# Patient Record
Sex: Female | Born: 2000 | Race: Black or African American | Hispanic: No | Marital: Single | Smoking: Never smoker
Health system: Southern US, Community
[De-identification: ages and names within clinical notes are randomized; demographics above are authoritative.]

## PROBLEM LIST (undated history)

## (undated) HISTORY — PX: NO PAST SURGERIES: SHX2092

---

## 2020-03-29 ENCOUNTER — Encounter (HOSPITAL_COMMUNITY): Payer: Self-pay

## 2020-03-29 ENCOUNTER — Other Ambulatory Visit: Payer: Self-pay

## 2020-03-29 ENCOUNTER — Ambulatory Visit (HOSPITAL_COMMUNITY)
Admission: EM | Admit: 2020-03-29 | Discharge: 2020-03-29 | Disposition: A | Discharge status: Home / Self Care | Payer: 59 | Attending: Family Medicine | Admitting: Family Medicine

## 2020-03-29 DIAGNOSIS — R112 Nausea with vomiting, unspecified: Secondary | ICD-10-CM | POA: Diagnosis not present

## 2020-03-29 DIAGNOSIS — K219 Gastro-esophageal reflux disease without esophagitis: Secondary | ICD-10-CM | POA: Diagnosis not present

## 2020-03-29 DIAGNOSIS — Z3202 Encounter for pregnancy test, result negative: Secondary | ICD-10-CM

## 2020-03-29 LAB — POCT URINALYSIS DIP (DEVICE)
Bilirubin Urine: NEGATIVE
Glucose, UA: NEGATIVE mg/dL
Hgb urine dipstick: NEGATIVE
Ketones, ur: NEGATIVE mg/dL
Leukocytes,Ua: NEGATIVE
Nitrite: NEGATIVE
Protein, ur: NEGATIVE mg/dL
Specific Gravity, Urine: 1.02 (ref 1.005–1.030)
Urobilinogen, UA: 0.2 mg/dL (ref 0.0–1.0)
pH: 8.5 — ABNORMAL HIGH (ref 5.0–8.0)

## 2020-03-29 LAB — POC URINE PREG, ED: Preg Test, Ur: NEGATIVE

## 2020-03-29 MED ORDER — ONDANSETRON 4 MG PO TBDP
4.0000 mg | ORAL_TABLET | Freq: Three times a day (TID) | ORAL | 0 refills | Status: DC | PRN
Start: 2020-03-29 — End: 2020-11-02

## 2020-03-29 MED ORDER — OMEPRAZOLE 20 MG PO CPDR
20.0000 mg | DELAYED_RELEASE_CAPSULE | Freq: Every day | ORAL | 1 refills | Status: DC
Start: 2020-03-29 — End: 2020-11-02

## 2020-03-29 NOTE — ED Provider Notes (Signed)
[facility]    CSN: [id number] Arrival date & time: 03/29/20  5701      History   Chief Complaint Chief Complaint  Patient presents with  . Nausea  . Emesis    1 episode    HPI Kaitlyn York is a 19 y.o. female.   Pt is a 19 year old female that presents with intermittent nausea, vomiting.  This is been ongoing off and on since February.  Worse over the last few days.  Reporting ate breakfast  yesterday and then a few hours later vomited.  She has had persistent nausea since.  Currently nauseous.  Denies any abdominal pain associated.  Symptoms are worse after eating symptoms and laying down.  Denies any urinary symptoms, constipation or diarrhea.  Denies any fever.  Currently sexually active.  Uses Nexplanon for birth control. Patient's last menstrual period was 03/24/2020 (exact date).   ROS per HPI      History reviewed. No pertinent past medical history.  There are no problems to display for this patient.   History reviewed. No pertinent surgical history.  OB History   No obstetric history on file.      Home Medications    Prior to Admission medications   Medication Sig Start Date End Date Taking? Authorizing Provider  etonogestrel (NEXPLANON) 68 MG IMPL implant 1 each by Subdermal route once.   Yes [provider]  omeprazole (PRILOSEC) 20 MG capsule Take 1 capsule (20 mg total) by mouth daily. 03/29/20   Dahlia Byes A, NP  ondansetron (ZOFRAN ODT) 4 MG disintegrating tablet Take 1 tablet (4 mg total) by mouth every 8 (eight) hours as needed for nausea or vomiting. 03/29/20   Janace Aris, NP    Family History History reviewed. No pertinent family history.  Social History Social History   Tobacco Use  . Smoking status: Never Smoker  . Smokeless tobacco: Never Used  Substance Use Topics  . Alcohol use: Yes    Comment: occasionally  . Drug use: Yes    Types: Marijuana     Allergies   Patient has no known  allergies.   Review of Systems Review of Systems   Physical Exam Triage Vital Signs ED Triage Vitals  Enc Vitals Group     BP 03/29/20 0940 (!) 157/82     Pulse Rate 03/29/20 0940 (!) 111     Resp 03/29/20 0940 16     Temp 03/29/20 0940 98.7 F (37.1 C)     Temp Source 03/29/20 0940 Oral     SpO2 03/29/20 0940 100 %     Weight --      Height --      Head Circumference --      Peak Flow --      Pain Score 03/29/20 0937 2     Pain Loc --      Pain Edu? --      Excl. in GC? --    No data found.  Updated Vital Signs BP (!) 157/82 (BP Location: Left Arm)   Pulse (!) 111   Temp 98.7 F (37.1 C) (Oral)   Resp 16   LMP 03/24/2020 (Exact Date)   SpO2 100%   Visual Acuity Right Eye Distance:   Left Eye Distance:   Bilateral Distance:    Right Eye Near:   Left Eye Near:    Bilateral Near:     Physical Exam Vitals and nursing note reviewed.  Constitutional:  General: She is not in acute distress.    Appearance: Normal appearance. She is not ill-appearing, toxic-appearing or diaphoretic.  HENT:     Head: Normocephalic.     Nose: Nose normal.     Mouth/Throat:     Pharynx: Oropharynx is clear.  Eyes:     Conjunctiva/sclera: Conjunctivae normal.  Pulmonary:     Effort: Pulmonary effort is normal.  Abdominal:     Palpations: Abdomen is soft.     Tenderness: There is no abdominal tenderness.     Comments: Abdomen soft, nontender  Musculoskeletal:        General: Normal range of motion.     Cervical back: Normal range of motion.  Skin:    General: Skin is warm and dry.     Findings: No rash.  Neurological:     Mental Status: She is alert.  Psychiatric:        Mood and Affect: Mood normal.      UC Treatments / Results  Labs (all labs ordered are listed, but only abnormal results are displayed) Labs Reviewed  POCT URINALYSIS DIP (DEVICE) - Abnormal; Notable for the following components:      Result Value   pH 8.5 (*)    All other components within  normal limits  POC URINE PREG, ED  POC URINE PREG, ED    EKG   Radiology No results found.  Procedures Procedures (including critical care time)  Medications Ordered in UC Medications - No data to display  Initial Impression / Assessment and Plan / UC Course  I have reviewed the triage vital signs and the nursing notes.  Pertinent labs & imaging results that were available during my care of the patient were reviewed by me and considered in my medical decision making (see chart for details).     GERD most likely cause of her symptoms.  No acute abdomen on exam Could be diet or stress related. We will have her start omeprazole daily with diet precautions and instructions. Zofran as needed for nausea, vomiting  recommended if symptoms continue or worsen she will need to see a GI specialist. Final Clinical Impressions(s) / UC Diagnoses   Final diagnoses:  Gastroesophageal reflux disease without esophagitis     Discharge Instructions     I believe your symptoms are associated with acid reflux.  We are going to try omeprazole 20 mg once daily  Make sure that you take the omeprazole 30- 60 minutes prior to a meal with a glass of water.  Avoid spicy, greasy foods, caffeine, chocolate and milk products.  No eating 2-3 hours before bedtime. Elevate the head of the bed 30 degrees.  Try this for a few weeks to see if this improves your symptoms.  If you don't see any improvement or your symptoms worsen please follow up with a GI  Zofran as needed for nausea and vomiting Try to reduce stress.       ED Prescriptions    Medication Sig Dispense Auth. Provider   omeprazole (PRILOSEC) 20 MG capsule Take 1 capsule (20 mg total) by mouth daily. 30 capsule Annamary Buschman A, NP   ondansetron (ZOFRAN ODT) 4 MG disintegrating tablet Take 1 tablet (4 mg total) by mouth every 8 (eight) hours as needed for nausea or vomiting. 20 tablet Loura Halt A, NP     PDMP not reviewed this  encounter.   Orvan July, NP 03/29/20 1014

## 2020-03-29 NOTE — Discharge Instructions (Addendum)
I believe your symptoms are associated with acid reflux.  We are going to try omeprazole 20 mg once daily  Make sure that you take the omeprazole 30- 60 minutes prior to a meal with a glass of water.  Avoid spicy, greasy foods, caffeine, chocolate and milk products.  No eating 2-3 hours before bedtime. Elevate the head of the bed 30 degrees.  Try this for a few weeks to see if this improves your symptoms.  If you don't see any improvement or your symptoms worsen please follow up with a GI  Zofran as needed for nausea and vomiting Try to reduce stress.

## 2020-03-29 NOTE — ED Triage Notes (Signed)
Patient reports nausea that is worse when lying down. Yesterday she had one episode of emesis.

## 2020-08-09 ENCOUNTER — Encounter (HOSPITAL_COMMUNITY): Payer: Self-pay

## 2020-08-09 ENCOUNTER — Emergency Department (HOSPITAL_COMMUNITY)
Admission: EM | Admit: 2020-08-09 | Discharge: 2020-08-09 | Disposition: A | Discharge status: Home / Self Care | Payer: 59 | Attending: Emergency Medicine | Admitting: Emergency Medicine

## 2020-08-09 ENCOUNTER — Emergency Department (HOSPITAL_COMMUNITY)
Admission: EM | Admit: 2020-08-09 | Discharge: 2020-08-10 | Disposition: A | Discharge status: Home / Self Care | Payer: 59 | Source: Home / Self Care | Attending: Emergency Medicine | Admitting: Emergency Medicine

## 2020-08-09 ENCOUNTER — Other Ambulatory Visit: Payer: Self-pay

## 2020-08-09 DIAGNOSIS — F1210 Cannabis abuse, uncomplicated: Secondary | ICD-10-CM | POA: Diagnosis not present

## 2020-08-09 DIAGNOSIS — F12188 Cannabis abuse with other cannabis-induced disorder: Secondary | ICD-10-CM

## 2020-08-09 DIAGNOSIS — R112 Nausea with vomiting, unspecified: Secondary | ICD-10-CM | POA: Diagnosis not present

## 2020-08-09 DIAGNOSIS — R1110 Vomiting, unspecified: Secondary | ICD-10-CM | POA: Diagnosis present

## 2020-08-09 DIAGNOSIS — R197 Diarrhea, unspecified: Secondary | ICD-10-CM

## 2020-08-09 DIAGNOSIS — R9431 Abnormal electrocardiogram [ECG] [EKG]: Secondary | ICD-10-CM

## 2020-08-09 DIAGNOSIS — Z79899 Other long term (current) drug therapy: Secondary | ICD-10-CM | POA: Insufficient documentation

## 2020-08-09 LAB — COMPREHENSIVE METABOLIC PANEL
ALT: 19 U/L (ref 0–44)
AST: 27 U/L (ref 15–41)
Albumin: 4.4 g/dL (ref 3.5–5.0)
Alkaline Phosphatase: 76 U/L (ref 38–126)
Anion gap: 17 — ABNORMAL HIGH (ref 5–15)
BUN: 8 mg/dL (ref 6–20)
CO2: 20 mmol/L — ABNORMAL LOW (ref 22–32)
Calcium: 9.4 mg/dL (ref 8.9–10.3)
Chloride: 101 mmol/L (ref 98–111)
Creatinine, Ser: 0.73 mg/dL (ref 0.44–1.00)
GFR calc Af Amer: >60 mL/min (ref >60)
GFR calc non Af Amer: >60 mL/min (ref >60)
Glucose, Bld: 124 mg/dL — ABNORMAL HIGH (ref 70–99)
Potassium: 3.3 mmol/L — ABNORMAL LOW (ref 3.5–5.1)
Sodium: 138 mmol/L (ref 135–145)
Total Bilirubin: 1.3 mg/dL — ABNORMAL HIGH (ref 0.3–1.2)
Total Protein: 8.2 g/dL — ABNORMAL HIGH (ref 6.5–8.1)

## 2020-08-09 LAB — URINALYSIS, ROUTINE W REFLEX MICROSCOPIC
Bilirubin Urine: NEGATIVE
Glucose, UA: 50 mg/dL — AB
Hgb urine dipstick: NEGATIVE
Ketones, ur: 80 mg/dL — AB
Leukocytes,Ua: NEGATIVE
Nitrite: NEGATIVE
Protein, ur: 100 mg/dL — AB
Specific Gravity, Urine: 1.036 — ABNORMAL HIGH (ref 1.005–1.030)
pH: 6 (ref 5.0–8.0)

## 2020-08-09 LAB — CBC
HCT: 49.8 % — ABNORMAL HIGH (ref 36.0–46.0)
Hemoglobin: 16.6 g/dL — ABNORMAL HIGH (ref 12.0–15.0)
MCH: 29.4 pg (ref 26.0–34.0)
MCHC: 33.3 g/dL (ref 30.0–36.0)
MCV: 88.3 fL (ref 80.0–100.0)
Platelets: 377 10*3/uL (ref 150–400)
RBC: 5.64 MIL/uL — ABNORMAL HIGH (ref 3.87–5.11)
RDW: 13.2 % (ref 11.5–15.5)
WBC: 9.9 10*3/uL (ref 4.0–10.5)
nRBC: 0 % (ref 0.0–0.2)

## 2020-08-09 LAB — LIPASE, BLOOD: Lipase: 20 U/L (ref 11–51)

## 2020-08-09 LAB — I-STAT BETA HCG BLOOD, ED (MC, WL, AP ONLY): I-stat hCG, quantitative: <5 m[IU]/mL (ref <5)

## 2020-08-09 MED ORDER — SODIUM CHLORIDE 0.9 % IV BOLUS
1000.0000 mL | Freq: Once | INTRAVENOUS | Status: AC
  Administered 2020-08-09: 1000 mL via INTRAVENOUS

## 2020-08-09 MED ORDER — POTASSIUM CHLORIDE CRYS ER 20 MEQ PO TBCR
40.0000 meq | EXTENDED_RELEASE_TABLET | Freq: Once | ORAL | Status: AC
  Administered 2020-08-09: 40 meq via ORAL
  Filled 2020-08-09: qty 2

## 2020-08-09 MED ORDER — ONDANSETRON 4 MG PO TBDP
4.0000 mg | ORAL_TABLET | Freq: Once | ORAL | Status: AC | PRN
  Administered 2020-08-09: 4 mg via ORAL
  Filled 2020-08-09: qty 1

## 2020-08-09 MED ORDER — HALOPERIDOL LACTATE 5 MG/ML IJ SOLN
2.0000 mg | Freq: Once | INTRAMUSCULAR | Status: AC
  Administered 2020-08-10: 2 mg via INTRAMUSCULAR
  Filled 2020-08-09: qty 1

## 2020-08-09 MED ORDER — PROMETHAZINE HCL 25 MG PO TABS
25.0000 mg | ORAL_TABLET | Freq: Four times a day (QID) | ORAL | 0 refills | Status: DC | PRN
Start: 2020-08-09 — End: 2020-11-02

## 2020-08-09 MED ORDER — METOCLOPRAMIDE HCL 5 MG/ML IJ SOLN
10.0000 mg | Freq: Once | INTRAMUSCULAR | Status: AC
  Administered 2020-08-09: 10 mg via INTRAVENOUS
  Filled 2020-08-09: qty 2

## 2020-08-09 NOTE — ED Triage Notes (Signed)
Pt reports she was here this morning for N/V and is now back for the same. Pt reports taking promethazine around 530p today and then vomiting about an hour later.

## 2020-08-09 NOTE — ED Notes (Signed)
Pt eating ice and drinking water at this time; no complaints of N/V

## 2020-08-09 NOTE — ED Notes (Signed)
Pt vomited 2x in triage.

## 2020-08-09 NOTE — Discharge Instructions (Signed)
Take the medications as needed instead of the Zofran. Make sure you are drinking plenty of fluids and avoid sugary drinks. Follow-up with your primary care provider or the 1 listed below. Return to the ER if you start to experience worsening diarrhea or bloody stools, vomiting, severe abdominal pain or fever.

## 2020-08-09 NOTE — ED Triage Notes (Signed)
Pt reports vomiting, nausea, and diarrhea since eating [employer] a few days ago. Reports that she took some Zofran PTA. States that she hasnt been able to keep anything down.

## 2020-08-09 NOTE — ED Provider Notes (Signed)
[facility]-EMERGENCY DEPT Provider Note   CSN: [id number] Arrival date & time: 08/09/20  0404     History Chief Complaint  Patient presents with  . Emesis    Kaitlyn York is a 19 y.o. female who presents to ED with a chief complaint of diarrhea and emesis.  States that she ate a meal at [employer] yesterday with some friends who are experiencing similar symptoms.  She had 2 episodes of nonbloody diarrhea and several episodes of nonbloody, nonbilious emesis since eating there.  She has Zofran at home which she took without much improvement in her symptoms.  She also tried drinking ginger ale and taking Pepto-Bismol with subsequent vomiting.  Denies abdominal pain, just reports feeling hungry.  Denies any urinary symptoms or possibility of pregnancy.  Denies any fevers, prior abdominal surgeries, chest pain, cough, pelvic pain.  HPI     History reviewed. No pertinent past medical history.  There are no problems to display for this patient.   History reviewed. No pertinent surgical history.   OB History   No obstetric history on file.     History reviewed. No pertinent family history.  Social History   Tobacco Use  . Smoking status: Never Smoker  . Smokeless tobacco: Never Used  Vaping Use  . Vaping Use: Never used  Substance Use Topics  . Alcohol use: Yes    Comment: occasionally  . Drug use: Yes    Types: Marijuana    Home Medications Prior to Admission medications   Medication Sig Start Date End Date Taking? Authorizing Provider  bismuth subsalicylate (PEPTO BISMOL) 262 MG/15ML suspension Take 30 mLs by mouth every 6 (six) hours as needed for indigestion.   Yes [provider]  etonogestrel (NEXPLANON) 68 MG IMPL implant 1 each by Subdermal route once.   Yes [provider]  ibuprofen (ADVIL) 200 MG tablet Take 600 mg by mouth every 6 (six) hours as needed for headache.   Yes [provider]  omeprazole (PRILOSEC)  20 MG capsule Take 1 capsule (20 mg total) by mouth daily. Patient not taking: Reported on 08/09/2020 03/29/20   Dahlia Byes A, NP  ondansetron (ZOFRAN ODT) 4 MG disintegrating tablet Take 1 tablet (4 mg total) by mouth every 8 (eight) hours as needed for nausea or vomiting. Patient not taking: Reported on 08/09/2020 03/29/20   Dahlia Byes A, NP  promethazine (PHENERGAN) 25 MG tablet Take 1 tablet (25 mg total) by mouth every 6 (six) hours as needed for nausea or vomiting. 08/09/20   Dietrich Pates, PA-C    Allergies    Patient has no known allergies.  Review of Systems   Review of Systems  Constitutional: Negative for appetite change, chills and fever.  HENT: Negative for ear pain, rhinorrhea, sneezing and sore throat.   Eyes: Negative for photophobia and visual disturbance.  Respiratory: Negative for cough, chest tightness, shortness of breath and wheezing.   Cardiovascular: Negative for chest pain and palpitations.  Gastrointestinal: Positive for diarrhea and vomiting. Negative for abdominal pain, blood in stool, constipation and nausea.  Genitourinary: Negative for dysuria, hematuria and urgency.  Musculoskeletal: Negative for myalgias.  Skin: Negative for rash.  Neurological: Negative for dizziness, weakness and light-headedness.    Physical Exam Updated Vital Signs BP (!) 145/89   Pulse 96   Temp 98.1 F (36.7 C) (Oral)   Resp 18   Ht 5\' 4"  (1.626 m)   Wt 73 kg   SpO2 100%   BMI  27.64 kg/m   Physical Exam Vitals and nursing note reviewed.  Constitutional:      General: She is not in acute distress.    Appearance: She is well-developed.  HENT:     Head: Normocephalic and atraumatic.     Nose: Nose normal.  Eyes:     General: No scleral icterus.       Right eye: No discharge.        Left eye: No discharge.     Conjunctiva/sclera: Conjunctivae normal.  Cardiovascular:     Rate and Rhythm: Normal rate and regular rhythm.     Heart sounds: Normal heart sounds. No murmur  heard.  No friction rub. No gallop.   Pulmonary:     Effort: Pulmonary effort is normal. No respiratory distress.     Breath sounds: Normal breath sounds.  Abdominal:     General: Bowel sounds are normal. There is no distension.     Palpations: Abdomen is soft.     Tenderness: There is no abdominal tenderness. There is no guarding.     Comments: Soft, nontender nondistended.  Musculoskeletal:        General: Normal range of motion.     Cervical back: Normal range of motion and neck supple.  Skin:    General: Skin is warm and dry.     Findings: No rash.  Neurological:     Mental Status: She is alert.     Motor: No abnormal muscle tone.     Coordination: Coordination normal.     ED Results / Procedures / Treatments   Labs (all labs ordered are listed, but only abnormal results are displayed) Labs Reviewed  COMPREHENSIVE METABOLIC PANEL - Abnormal; Notable for the following components:      Result Value   Potassium 3.3 (*)    CO2 20 (*)    Glucose, Bld 124 (*)    Total Protein 8.2 (*)    Total Bilirubin 1.3 (*)    Anion gap 17 (*)    All other components within normal limits  CBC - Abnormal; Notable for the following components:   RBC 5.64 (*)    Hemoglobin 16.6 (*)    HCT 49.8 (*)    All other components within normal limits  URINALYSIS, ROUTINE W REFLEX MICROSCOPIC - Abnormal; Notable for the following components:   Color, Urine AMBER (*)    Specific Gravity, Urine 1.036 (*)    Glucose, UA 50 (*)    Ketones, ur 80 (*)    Protein, ur 100 (*)    Bacteria, UA RARE (*)    All other components within normal limits  LIPASE, BLOOD  I-STAT BETA HCG BLOOD, ED ([facility], [facility], [facility] ONLY)    EKG None  Radiology No results found.  Procedures Procedures (including critical care time)  Medications Ordered in ED Medications  potassium chloride SA (KLOR-CON) CR tablet 40 mEq (has no administration in time range)  ondansetron (ZOFRAN-ODT) disintegrating tablet 4 mg (4 mg Oral  Given 08/09/20 0536)  sodium chloride 0.9 % bolus 1,000 mL (0 mLs Intravenous Stopped 08/09/20 0940)  metoCLOPramide (REGLAN) injection 10 mg (10 mg Intravenous Given 08/09/20 0746)    ED Course  I have reviewed the triage vital signs and the nursing notes.  Pertinent labs & imaging results that were available during my care of the patient were reviewed by me and considered in my medical decision making (see chart for details).  Clinical Course as of Aug 10 943  [facility]  Aug 09, 2020  0711 Called lab regarding patient's lab work which is not yet in process.  They will begin running it now despite it being collected 2hrs ago.   [HK]    Clinical Course User Index [HK] Dietrich Pates, PA-C   MDM Rules/Calculators/A&P                          19 year old female presenting to the ED with a chief complaint of diarrhea and emesis since eating a meal at [employer] yesterday.  Sick contacts with similar symptoms.  2 episodes of nonbloody diarrhea and several episodes of nonbloody, nonbilious emesis.  Minimal improvement noted with her home Zofran as well as taking Pepto-Bismol.  Denies abdominal pain.  No urinary symptoms, pelvic complaints or possibility of pregnancy.  On exam abdomen is soft, nontender nondistended.  She was given ODT Zofran in triage but had subsequent vomiting.  She is afebrile here without recent use of antipyretics.  Urinalysis with some ketones, rare bacteria.  hCG is negative.  Will give IV fluids, antiemetics and reassess as we await lab work.  8:52 AM Lab work showing some hemoconcentration of CBC.  CMP significant for anion gap of 17, potassium of 3.3 which is repleted orally.  Lipase is unremarkable.  Patient with significant improvement in her symptoms with IV fluids and antiemetics.  She is able to tolerate p.o. intake.  Suspect that her symptoms are viral in nature.  Abdominal exams remain benign so I doubt acute surgical cause of her symptoms such as cholecystitis, appendicitis or  perforation.  She is comfortable with discharge home with instructions to slowly advance her diet and take antiemetics as needed.  Strict return precautions given.   Patient is hemodynamically stable, in NAD, and able to ambulate in the ED. Evaluation does not show pathology that would require ongoing emergent intervention or inpatient treatment. I explained the diagnosis to the patient. Pain has been managed and has no complaints prior to discharge. Patient is comfortable with above plan and is stable for discharge at this time. All questions were answered prior to disposition. Strict return precautions for returning to the ED were discussed. Encouraged follow up with PCP.   An After Visit Summary was printed and given to the patient.   Portions of this note were generated with Scientist, clinical (histocompatibility and immunogenetics). Dictation errors may occur despite best attempts at proofreading.  Final Clinical Impression(s) / ED Diagnoses Final diagnoses:  Nausea vomiting and diarrhea    Rx / DC Orders ED Discharge Orders         Ordered    promethazine (PHENERGAN) 25 MG tablet  Every 6 hours PRN        08/09/20 0854           Dietrich Pates, PA-C 08/09/20 0944    Mancel Bale, MD 08/09/20 [phone]

## 2020-08-10 NOTE — ED Notes (Signed)
Per Dr. Nicanor Alcon hold labs at this time will reassess need due to labs previously drawn advised to continue with IM haldol.

## 2020-08-10 NOTE — ED Notes (Signed)
PT tolerated ginger-ale at this time

## 2020-08-10 NOTE — ED Provider Notes (Signed)
[facility]-EMERGENCY DEPT Provider Note   CSN: [id number] Arrival date & time: 08/09/20  2116     History Chief Complaint  Patient presents with  . Emesis    Kaitlyn York is a 19 y.o. female.  The history is provided by the patient.  Emesis Severity:  Moderate Duration:  1 day Timing:  Intermittent Quality:  Stomach contents Progression:  Unchanged Chronicity:  Recurrent Recent urination:  Normal Context: not post-tussive   Relieved by:  Nothing Worsened by:  Nothing Ineffective treatments:  None tried Associated symptoms: no abdominal pain, no fever, no headaches, no myalgias, no sore throat and no URI   Risk factors: no alcohol use   Risk factors comment:  Marijuana use       History reviewed. No pertinent past medical history.  There are no problems to display for this patient.   History reviewed. No pertinent surgical history.   OB History   No obstetric history on file.     History reviewed. No pertinent family history.  Social History   Tobacco Use  . Smoking status: Never Smoker  . Smokeless tobacco: Never Used  Vaping Use  . Vaping Use: Never used  Substance Use Topics  . Alcohol use: Yes    Comment: occasionally  . Drug use: Yes    Types: Marijuana    Home Medications Prior to Admission medications   Medication Sig Start Date End Date Taking? Authorizing Provider  bismuth subsalicylate (PEPTO BISMOL) 262 MG/15ML suspension Take 30 mLs by mouth every 6 (six) hours as needed for indigestion.   Yes [provider]  etonogestrel (NEXPLANON) 68 MG IMPL implant 1 each by Subdermal route once.   Yes [provider]  ibuprofen (ADVIL) 200 MG tablet Take 600 mg by mouth every 6 (six) hours as needed for headache.   Yes [provider]  Multiple Vitamin (MULTIVITAMIN) capsule Take 1 capsule by mouth daily.   Yes [provider]  omega-3 acid ethyl esters (LOVAZA) 1 g capsule Take 1 g by  mouth 2 (two) times daily.   Yes [provider]  omeprazole (PRILOSEC) 20 MG capsule Take 1 capsule (20 mg total) by mouth daily. 03/29/20  Yes Bast, Traci A, NP  promethazine (PHENERGAN) 25 MG tablet Take 1 tablet (25 mg total) by mouth every 6 (six) hours as needed for nausea or vomiting. 08/09/20  Yes Khatri, Hina, PA-C  ondansetron (ZOFRAN ODT) 4 MG disintegrating tablet Take 1 tablet (4 mg total) by mouth every 8 (eight) hours as needed for nausea or vomiting. Patient not taking: Reported on 08/09/2020 03/29/20   Janace Aris, NP    Allergies    Patient has no known allergies.  Review of Systems   Review of Systems  Constitutional: Negative for fever.  HENT: Negative for sore throat.   Eyes: Negative for visual disturbance.  Respiratory: Negative for shortness of breath.   Cardiovascular: Negative for chest pain.  Gastrointestinal: Positive for nausea and vomiting. Negative for abdominal pain.  Genitourinary: Negative for difficulty urinating.  Musculoskeletal: Negative for myalgias.  Skin: Negative for rash.  Neurological: Negative for headaches.  Psychiatric/Behavioral: Negative for agitation.  All other systems reviewed and are negative.   Physical Exam Updated Vital Signs BP 128/83   Pulse 74   Temp 99 F (37.2 C) (Oral)   Resp 16   SpO2 100%   Physical Exam Vitals and nursing note reviewed.  Constitutional:      General: She is  not in acute distress.    Appearance: Normal appearance.  HENT:     Head: Normocephalic and atraumatic.     Nose: Nose normal.  Eyes:     Conjunctiva/sclera: Conjunctivae normal.     Pupils: Pupils are equal, round, and reactive to light.  Cardiovascular:     Rate and Rhythm: Normal rate and regular rhythm.     Pulses: Normal pulses.     Heart sounds: Normal heart sounds.  Pulmonary:     Effort: Pulmonary effort is normal.     Breath sounds: Normal breath sounds.  Abdominal:     General: Abdomen is flat. Bowel sounds are  normal.     Palpations: Abdomen is soft.     Tenderness: There is no abdominal tenderness. There is no guarding.  Musculoskeletal:        General: Normal range of motion.     Cervical back: Normal range of motion and neck supple.  Skin:    General: Skin is warm and dry.     Capillary Refill: Capillary refill takes less than 2 seconds.  Neurological:     General: No focal deficit present.     Mental Status: She is alert and oriented to person, place, and time.  Psychiatric:        Mood and Affect: Mood normal.        Behavior: Behavior normal.     ED Results / Procedures / Treatments   Labs (all labs ordered are listed, but only abnormal results are displayed) Labs Reviewed  CBC WITH DIFFERENTIAL/PLATELET  I-STAT CHEM 8, ED  I-STAT BETA HCG BLOOD, ED ([facility], [facility], [facility] ONLY)  TROPONIN I (HIGH SENSITIVITY)    EKG None  Radiology No results found.  Procedures Procedures (including critical care time)  Medications Ordered in ED Medications  haloperidol lactate (HALDOL) injection 2 mg (2 mg Intramuscular Given 08/10/20 0032)    ED Course  I have reviewed the triage vital signs and the nursing notes.  Pertinent labs & imaging results that were available during my care of the patient were reviewed by me and considered in my medical decision making (see chart for details).    1239 case d/w Herbert Seta, fellow for cardiology who has reviewed EKG.  Can be discharged with close follow up if no chest pain or syncope.  Patient to call office for outpatient testing  1245 discussed findings of EKG with patient.  Patient denies any chest pain, SOB or syncope.  Informed patient of the abnormal finding on EKG and need to call cardiology in AM for close follow up.  Patient verbalizes understanding and agrees to follow up   Patient informed of need to stop marijuana use as this is almost certainly what has caused her repeat episodes of vomiting in the last month.  Patient is grateful for this  information and agrees to stop.   Given haldol, resting comfortably.  No further emesis.    PO challenged successfully in the department.   Kaitlyn York was evaluated in Emergency Department on 08/10/2020 for the symptoms described in the history of present illness. She was evaluated in the context of the global COVID-19 pandemic, which necessitated consideration that the patient might be at risk for infection with the SARS-CoV-2 virus that causes COVID-19. Institutional protocols and algorithms that pertain to the evaluation of patients at risk for COVID-19 are in a state of rapid change based on information released by regulatory bodies including the CDC and federal and state organizations. These  policies and algorithms were followed during the patient's care in the ED.  Final Clinical Impression(s) / ED Diagnoses  Return for intractable cough, coughing up blood,fevers >100.4 unrelieved by medication, shortness of breath, intractable vomiting, chest pain, shortness of breath, weakness,numbness, changes in speech, facial asymmetry,abdominal pain, passing out,Inability to tolerate liquids or food, cough, altered mental status or any concerns. No signs of systemic illness or infection. The patient is nontoxic-appearing on exam and vital signs are within normal limits.   I have reviewed the triage vital signs and the nursing notes. Pertinent labs &imaging results that were available during my care of the patient were reviewed by me and considered in my medical decision making (see chart for details).After history, exam, and medical workup I feel the patient has beenappropriately medically screened and is safe for discharge home. Pertinent diagnoses were discussed with the patient. Patient was given return precautions.      Vernell Back, MD 08/10/20 [phone]

## 2020-08-10 NOTE — ED Notes (Signed)
Patient denies pain and is resting comfortably.  

## 2020-11-02 ENCOUNTER — Other Ambulatory Visit: Payer: Self-pay

## 2020-11-02 ENCOUNTER — Ambulatory Visit: Payer: Self-pay

## 2020-11-02 ENCOUNTER — Ambulatory Visit: Payer: 59 | Attending: Internal Medicine | Admitting: Internal Medicine

## 2020-11-02 ENCOUNTER — Encounter: Payer: Self-pay | Admitting: Internal Medicine

## 2020-11-02 VITALS — BP 140/80 | HR 92 | Temp 96.6°F | Resp 16 | Ht 64.0 in | Wt 168.0 lb

## 2020-11-02 DIAGNOSIS — F321 Major depressive disorder, single episode, moderate: Secondary | ICD-10-CM | POA: Diagnosis not present

## 2020-11-02 DIAGNOSIS — Z7689 Persons encountering health services in other specified circumstances: Secondary | ICD-10-CM

## 2020-11-02 DIAGNOSIS — Z7289 Other problems related to lifestyle: Secondary | ICD-10-CM | POA: Insufficient documentation

## 2020-11-02 DIAGNOSIS — Z2821 Immunization not carried out because of patient refusal: Secondary | ICD-10-CM

## 2020-11-02 DIAGNOSIS — E663 Overweight: Secondary | ICD-10-CM

## 2020-11-02 DIAGNOSIS — F411 Generalized anxiety disorder: Secondary | ICD-10-CM | POA: Insufficient documentation

## 2020-11-02 DIAGNOSIS — Z113 Encounter for screening for infections with a predominantly sexual mode of transmission: Secondary | ICD-10-CM

## 2020-11-02 DIAGNOSIS — Z8619 Personal history of other infectious and parasitic diseases: Secondary | ICD-10-CM

## 2020-11-02 DIAGNOSIS — R030 Elevated blood-pressure reading, without diagnosis of hypertension: Secondary | ICD-10-CM | POA: Insufficient documentation

## 2020-11-02 DIAGNOSIS — Z114 Encounter for screening for human immunodeficiency virus [HIV]: Secondary | ICD-10-CM

## 2020-11-02 DIAGNOSIS — Z1159 Encounter for screening for other viral diseases: Secondary | ICD-10-CM

## 2020-11-02 MED ORDER — ACYCLOVIR 400 MG PO TABS: 400.0000 mg | ORAL_TABLET | Freq: Two times a day (BID) | ORAL | 2 refills | Status: DC

## 2020-11-02 MED ORDER — ACYCLOVIR 400 MG PO TABS: 400.0000 mg | ORAL_TABLET | Freq: Every day | ORAL | 2 refills | Status: DC

## 2020-11-02 MED ORDER — SERTRALINE HCL 50 MG PO TABS: ORAL_TABLET | ORAL | 1 refills | Status: DC

## 2020-11-02 NOTE — Patient Instructions (Signed)
Try to get in some form of moderate intensity exercise 3-4 days a week for 30-45 minutes.   DASH Eating Plan DASH stands for "Dietary Approaches to Stop Hypertension." The DASH eating plan is a healthy eating plan that has been shown to reduce high blood pressure (hypertension). It may also reduce your risk for type 2 diabetes, heart disease, and stroke. The DASH eating plan may also help with weight loss. What are tips for following this plan?  General guidelines  Avoid eating more than 2,300 mg (milligrams) of salt (sodium) a day. If you have hypertension, you may need to reduce your sodium intake to 1,500 mg a day.  Limit alcohol intake to no more than 1 drink a day for nonpregnant women and 2 drinks a day for men. One drink equals 12 oz of beer, 5 oz of wine, or 1 oz of hard liquor.  Work with your health care provider to maintain a healthy body weight or to lose weight. Ask what an ideal weight is for you.  Get at least 30 minutes of exercise that causes your heart to beat faster (aerobic exercise) most days of the week. Activities may include walking, swimming, or biking.  Work with your health care provider or diet and nutrition specialist (dietitian) to adjust your eating plan to your individual calorie needs. Reading food labels   Check food labels for the amount of sodium per serving. Choose foods with less than 5 percent of the Daily Value of sodium. Generally, foods with less than 300 mg of sodium per serving fit into this eating plan.  To find whole grains, look for the word "whole" as the first word in the ingredient list. Shopping  Buy products labeled as "low-sodium" or "no salt added."  Buy fresh foods. Avoid canned foods and premade or frozen meals. Cooking  Avoid adding salt when cooking. Use salt-free seasonings or herbs instead of table salt or sea salt. Check with your health care provider or pharmacist before using salt substitutes.  Do not fry foods. Cook  foods using healthy methods such as baking, boiling, grilling, and broiling instead.  Cook with heart-healthy oils, such as olive, canola, soybean, or sunflower oil. Meal planning  Eat a balanced diet that includes: ? 5 or more servings of fruits and vegetables each day. At each meal, try to fill half of your plate with fruits and vegetables. ? Up to 6-8 servings of whole grains each day. ? Less than 6 oz of lean meat, poultry, or fish each day. A 3-oz serving of meat is about the same size as a deck of cards. One egg equals 1 oz. ? 2 servings of low-fat dairy each day. ? A serving of nuts, seeds, or beans 5 times each week. ? Heart-healthy fats. Healthy fats called Omega-3 fatty acids are found in foods such as flaxseeds and coldwater fish, like sardines, salmon, and mackerel.  Limit how much you eat of the following: ? Canned or prepackaged foods. ? Food that is high in trans fat, such as fried foods. ? Food that is high in saturated fat, such as fatty meat. ? Sweets, desserts, sugary drinks, and other foods with added sugar. ? Full-fat dairy products.  Do not salt foods before eating.  Try to eat at least 2 vegetarian meals each week.  Eat more home-cooked food and less restaurant, buffet, and fast food.  When eating at a restaurant, ask that your food be prepared with less salt or no salt, if  possible. What foods are recommended? The items listed may not be a complete list. Talk with your dietitian about what dietary choices are best for you. Grains Whole-grain or whole-wheat bread. Whole-grain or whole-wheat pasta. Brown rice. [place] Morrow. Bulgur. Whole-grain and low-sodium cereals. Pita bread. Low-fat, low-sodium crackers. Whole-wheat flour tortillas. Vegetables Fresh or frozen vegetables (raw, steamed, roasted, or grilled). Low-sodium or reduced-sodium tomato and vegetable juice. Low-sodium or reduced-sodium tomato sauce and tomato paste. Low-sodium or reduced-sodium canned  vegetables. Fruits All fresh, dried, or frozen fruit. Canned fruit in natural juice (without added sugar). Meat and other protein foods Skinless chicken or [place]. Ground chicken or [place]. Pork with fat trimmed off. Fish and seafood. Egg whites. Dried beans, peas, or lentils. Unsalted nuts, nut butters, and seeds. Unsalted canned beans. Lean cuts of beef with fat trimmed off. Low-sodium, lean deli meat. Dairy Low-fat (1%) or fat-free (skim) milk. Fat-free, low-fat, or reduced-fat cheeses. Nonfat, low-sodium ricotta or cottage cheese. Low-fat or nonfat yogurt. Low-fat, low-sodium cheese. Fats and oils Soft margarine without trans fats. Vegetable oil. Low-fat, reduced-fat, or light mayonnaise and salad dressings (reduced-sodium). Canola, safflower, olive, soybean, and sunflower oils. Avocado. Seasoning and other foods Herbs. Spices. Seasoning mixes without salt. Unsalted popcorn and pretzels. Fat-free sweets. What foods are not recommended? The items listed may not be a complete list. Talk with your dietitian about what dietary choices are best for you. Grains Baked goods made with fat, such as croissants, muffins, or some breads. Dry pasta or rice meal packs. Vegetables Creamed or fried vegetables. Vegetables in a cheese sauce. Regular canned vegetables (not low-sodium or reduced-sodium). Regular canned tomato sauce and paste (not low-sodium or reduced-sodium). Regular tomato and vegetable juice (not low-sodium or reduced-sodium). Angie Fava. Olives. Fruits Canned fruit in a light or heavy syrup. Fried fruit. Fruit in cream or butter sauce. Meat and other protein foods Fatty cuts of meat. Ribs. Fried meat. Berniece Salines. Sausage. Bologna and other processed lunch meats. Salami. Fatback. Hotdogs. Bratwurst. Salted nuts and seeds. Canned beans with added salt. Canned or smoked fish. Whole eggs or egg yolks. Chicken or [place] with skin. Dairy Whole or 2% milk, cream, and half-and-half. Whole or full-fat  cream cheese. Whole-fat or sweetened yogurt. Full-fat cheese. Nondairy creamers. Whipped toppings. Processed cheese and cheese spreads. Fats and oils Butter. Stick margarine. Lard. Shortening. Ghee. Bacon fat. Tropical oils, such as coconut, palm kernel, or palm oil. Seasoning and other foods Salted popcorn and pretzels. Onion salt, garlic salt, seasoned salt, table salt, and sea salt. Worcestershire sauce. Tartar sauce. Barbecue sauce. Teriyaki sauce. Soy sauce, including reduced-sodium. Steak sauce. Canned and packaged gravies. Fish sauce. Oyster sauce. Cocktail sauce. Horseradish that you find on the shelf. Ketchup. Mustard. Meat flavorings and tenderizers. Bouillon cubes. Hot sauce and Tabasco sauce. Premade or packaged marinades. Premade or packaged taco seasonings. Relishes. Regular salad dressings. Where to find more information:  National Heart, Lung, and [place]: https://wilson-eaton.com/  American Heart Association: www.heart.org Summary  The DASH eating plan is a healthy eating plan that has been shown to reduce high blood pressure (hypertension). It may also reduce your risk for type 2 diabetes, heart disease, and stroke.  With the DASH eating plan, you should limit salt (sodium) intake to 2,300 mg a day. If you have hypertension, you may need to reduce your sodium intake to 1,500 mg a day.  When on the DASH eating plan, aim to eat more fresh fruits and vegetables, whole grains, lean proteins, low-fat dairy, and heart-healthy fats.  Work with your health care provider or diet and nutrition specialist (dietitian) to adjust your eating plan to your individual calorie needs. This information is not intended to replace advice given to you by your health care provider. Make sure you discuss any questions you have with your health care provider. Document Revised: 11/02/2017 Document Reviewed: 11/13/2016 Elsevier Patient Education  2020 Else[employer] Sertraline tablets What is this  medicine? SERTRALINE (SER tra leen) is used to treat depression. It may also be used to treat obsessive compulsive disorder, panic disorder, post-trauma stress, premenstrual dysphoric disorder (PMDD) or social anxiety. This medicine may be used for other purposes; ask your health care provider or pharmacist if you have questions. COMMON BRAND NAME(S): Zoloft What should I tell my health care provider before I take this medicine? They need to know if you have any of these conditions:  bleeding disorders  bipolar disorder or a family history of bipolar disorder  glaucoma  heart disease  high blood pressure  history of irregular heartbeat  history of low levels of calcium, magnesium, or potassium in the blood  if you often drink alcohol  liver disease  receiving electroconvulsive therapy  seizures  suicidal thoughts, plans, or attempt; a previous suicide attempt by you or a family member  take medicines that treat or prevent blood clots  thyroid disease  an unusual or allergic reaction to sertraline, other medicines, foods, dyes, or preservatives  pregnant or trying to get pregnant  breast-feeding How should I use this medicine? Take this medicine by mouth with a glass of water. Follow the directions on the prescription label. You can take it with or without food. Take your medicine at regular intervals. Do not take your medicine more often than directed. Do not stop taking this medicine suddenly except upon the advice of your doctor. Stopping this medicine too quickly may cause serious side effects or your condition may worsen. A special MedGuide will be given to you by the pharmacist with each prescription and refill. Be sure to read this information carefully each time. Talk to your pediatrician regarding the use of this medicine in children. While this drug may be prescribed for children as young as 7 years for selected conditions, precautions do apply. Overdosage: If you  think you have taken too much of this medicine contact a poison control center or emergency room at once. NOTE: This medicine is only for you. Do not share this medicine with others. What if I miss a dose? If you miss a dose, take it as soon as you can. If it is almost time for your next dose, take only that dose. Do not take double or extra doses. What may interact with this medicine? Do not take this medicine with any of the following medications:  cisapride  dronedarone  linezolid  MAOIs like Carbex, Eldepryl, Marplan, Nardil, and Parnate  methylene blue (injected into a vein)  pimozide  thioridazine This medicine may also interact with the following medications:  alcohol  amphetamines  aspirin and aspirin-like medicines  certain medicines for depression, anxiety, or psychotic disturbances  certain medicines for fungal infections like ketoconazole, fluconazole, posaconazole, and itraconazole  certain medicines for irregular heart beat like flecainide, quinidine, propafenone  certain medicines for migraine headaches like almotriptan, eletriptan, frovatriptan, naratriptan, rizatriptan, sumatriptan, zolmitriptan  certain medicines for sleep  certain medicines for seizures like carbamazepine, valproic acid, phenytoin  certain medicines that treat or prevent blood clots like warfarin, enoxaparin, dalteparin  cimetidine  digoxin  diuretics  fentanyl  isoniazid  lithium  NSAIDs, medicines for pain and inflammation, like ibuprofen or naproxen  other medicines that prolong the QT interval (cause an abnormal heart rhythm) like dofetilide  rasagiline  safinamide  supplements like St. John's wort, kava kava, valerian  tolbutamide  tramadol  tryptophan This list may not describe all possible interactions. Give your health care provider a list of all the medicines, herbs, non-prescription drugs, or dietary supplements you use. Also tell them if you smoke,  drink alcohol, or use illegal drugs. Some items may interact with your medicine. What should I watch for while using this medicine? Tell your doctor if your symptoms do not get better or if they get worse. Visit your doctor or health care professional for regular checks on your progress. Because it may take several weeks to see the full effects of this medicine, it is important to continue your treatment as prescribed by your doctor. Patients and their families should watch out for new or worsening thoughts of suicide or depression. Also watch out for sudden changes in feelings such as feeling anxious, agitated, panicky, irritable, hostile, aggressive, impulsive, severely restless, overly excited and hyperactive, or not being able to sleep. If this happens, especially at the beginning of treatment or after a change in dose, call your health care professional. Bonita Quin may get drowsy or dizzy. Do not drive, use machinery, or do anything that needs mental alertness until you know how this medicine affects you. Do not stand or sit up quickly, especially if you are an older patient. This reduces the risk of dizzy or fainting spells. Alcohol may interfere with the effect of this medicine. Avoid alcoholic drinks. Your mouth may get dry. Chewing sugarless gum or sucking hard candy, and drinking plenty of water may help. Contact your doctor if the problem does not go away or is severe. What side effects may I notice from receiving this medicine? Side effects that you should report to your doctor or health care professional as soon as possible:  allergic reactions like skin rash, itching or hives, swelling of the face, lips, or tongue  anxious  black, tarry stools  changes in vision  confusion  elevated mood, decreased need for sleep, racing thoughts, impulsive behavior  eye pain  fast, irregular heartbeat  feeling faint or lightheaded, falls  feeling agitated, angry, or irritable  hallucination, loss  of contact with reality  loss of balance or coordination  loss of memory  painful or prolonged erections  restlessness, pacing, inability to keep still  seizures  stiff muscles  suicidal thoughts or other mood changes  trouble sleeping  unusual bleeding or bruising  unusually weak or tired  vomiting Side effects that usually do not require medical attention (report to your doctor or health care professional if they continue or are bothersome):  change in appetite or weight  change in sex drive or performance  diarrhea  increased sweating  indigestion, nausea  tremors This list may not describe all possible side effects. Call your doctor for medical advice about side effects. You may report side effects to FDA at 1-800-FDA-1088. Where should I keep my medicine? Keep out of the reach of children. Store at room temperature between 15 and 30 degrees C (59 and 86 degrees F). Throw away any unused medicine after the expiration date. NOTE: This sheet is a summary. It may not cover all possible information. If you have questions about this medicine, talk to your doctor, pharmacist, or health  care provider.  2020 Elsevier/Gold Standard (2018-11-12 10:09:27)

## 2020-11-02 NOTE — Telephone Encounter (Signed)
Patient says she had an appointment today and forgot to ask Dr. Laural Benes at what time after Herpes outbreak is it safe for sexual activity. She says she had an outbreak 1 week ago, 1 bump and symptoms as before. She says on Saturday the bump dried up. I advised I will send this to Dr. Laural Benes for review and recommendation, advised someone from the office will call with her advice. Patient verbalized understanding.  Reason for Disposition . [1] Caller requesting NON-URGENT health information AND [2] PCP's office is the best resource  Answer Assessment - Initial Assessment Questions 1. REASON FOR CALL or QUESTION: "What is your reason for calling today?" or "How can I best help you?" or "What question do you have that I can help answer?"     Questions about herpes that  Protocols used: INFORMATION ONLY CALL - NO TRIAGE-A-AH

## 2020-11-02 NOTE — Addendum Note (Signed)
Addended by: Jonah Blue B on: 11/02/2020 01:38 PM   Modules accepted: Orders

## 2020-11-02 NOTE — Progress Notes (Addendum)
Patient ID: Kaitlyn DailyDiamond York, female    DOB: 12/18/2000  MRN: [id number]  CC: New Patient (Initial Visit)   Subjective: Kaitlyn York is a 19 y.o. female who presents for new pt visit Her concerns today include:   No previous PCP Hx of genital herpes.  1st outbreak was 06/2020.  One other outbreak since then. Also treated for GC/chlamydia, Trich in July.  No longer with the same partner.  She is with a new partner since September of this year.  She has Nexplanon for birth control.  Nexplanon is due to be removed in January of next year.  She does not think she will have it replaced.  BP elev today. Reports being told in past that BP elev by dentist, [facility] and [facility].  Vapes intermittently.  Stopped 2 days ago.  Smokes marijuana daily.  BMI 28.  Not very active since COVID.  Does on-line classes. Admits eating habits are not good.  Does not eat as much fruits as should.  Does not eat vegetables at all.  Eats out a lot, not motivated to [employer]  Pos Dep/Anx screen:  Started in middle school. Moved to several different neighborhoods and schools in middle school so felt she never really fit in.  She feels she has gone through about depression and at times she feels numb.  Reports having rocky relationships with most females in her life.  She is not very social.  She is not dating a new female friend's in September of this year.  Endorses feelings of wanting to hurt herself all the time but has no active plans currently.    HM:  Completed Moderna.  Declines flu shot.  Not sure if she had Tdap; will have mom check her immunization records.  Past medical, social, family history and surgical history reviewed and updated. Patient Active Problem List   Diagnosis Date Noted  . Influenza vaccine refused 11/02/2020     No current outpatient medications on file prior to visit.   No current facility-administered medications on file prior to visit.    No Known Allergies  Social History    Socioeconomic History  . Marital status: Single    Spouse name: Not on file  . Number of children: 0  . Years of education: Not on file  . Highest education level: Some college, no degree  Occupational History  . Not on file  Tobacco Use  . Smoking status: Never Smoker  . Smokeless tobacco: Never Used  Vaping Use  . Vaping Use: Former  . Substances: Nicotine  Substance and Sexual Activity  . Alcohol use: Yes    Comment: 2 x a mth  . Drug use: Yes    Frequency: 7.0 times per week    Types: Marijuana  . Sexual activity: Yes    Birth control/protection: Implant  Other Topics Concern  . Not on file  Social History Narrative  . Not on file   Social Determinants of Health   Financial Resource Strain:   . Difficulty of Paying Living Expenses: Not on file  Food Insecurity:   . Worried About Programme researcher, broadcasting/film/videounning Out of Food in the Last Year: Not on file  . Ran Out of Food in the Last Year: Not on file  Transportation Needs:   . Lack of Transportation (Medical): Not on file  . Lack of Transportation (Non-Medical): Not on file  Physical Activity:   . Days of Exercise per Week: Not on file  . Minutes of  Exercise per Session: Not on file  Stress:   . Feeling of Stress : Not on file  Social Connections:   . Frequency of Communication with Friends and Family: Not on file  . Frequency of Social Gatherings with Friends and Family: Not on file  . Attends Religious Services: Not on file  . Active Member of Clubs or Organizations: Not on file  . Attends Banker Meetings: Not on file  . Marital Status: Not on file  Intimate Partner Violence:   . Fear of Current or Ex-Partner: Not on file  . Emotionally Abused: Not on file  . Physically Abused: Not on file  . Sexually Abused: Not on file    Family History  Problem Relation Age of Onset  . Hypertension Maternal Aunt   . Hypertension Maternal Uncle   . Diabetes Maternal Grandmother   . Hypertension Maternal Grandmother      Past Surgical History:  Procedure Laterality Date  . NO PAST SURGERIES      ROS: Review of Systems Negative except as stated above  PHYSICAL EXAM: BP 140/80   Pulse 92   Temp (!) 96.6 F (35.9 C)   Resp 16   Ht 5\' 4"  (1.626 m)   Wt 168 lb (76.2 kg)   SpO2 97%   BMI 28.84 kg/m   Wt Readings from Last 3 Encounters:  11/02/20 168 lb (76.2 kg)  08/09/20 161 lb (73 kg)    Physical Exam  General appearance - alert, well appearing, young African-American female and in no distress Mental status - normal mood, behavior, speech, dress, motor activity, and thought processes Eyes - pupils equal and reactive, extraocular eye movements intact Nose - normal and patent, no erythema, discharge or polyps Mouth - mucous membranes moist, pharynx normal without lesions Neck - supple, no significant adenopathy Lymphatics - no palpable lymphadenopathy, no hepatosplenomegaly Chest - clear to auscultation, no wheezes, rales or rhonchi, symmetric air entry Heart - normal rate, regular rhythm, normal S1, S2, no murmurs, rubs, clicks or gallops Extremities - peripheral pulses normal, no pedal edema, no clubbing or cyanosis   CMP Latest Ref Rng & Units 08/09/2020  Glucose 70 - 99 mg/dL 10/09/2020)  BUN 6 - 20 mg/dL 8  Creatinine 496(P - 5.91 mg/dL 6.38  Sodium 4.66 - 599 mmol/L 138  Potassium 3.5 - 5.1 mmol/L 3.3(L)  Chloride 98 - 111 mmol/L 101  CO2 22 - 32 mmol/L 20(L)  Calcium 8.9 - 10.3 mg/dL 9.4  Total Protein 6.5 - 8.1 g/dL 8.2(H)  Total Bilirubin 0.3 - 1.2 mg/dL 357)  Alkaline Phos 38 - 126 U/L 76  AST 15 - 41 U/L 27  ALT 0 - 44 U/L 19   Lipid Panel  No results found for: CHOL, TRIG, HDL, CHOLHDL, VLDL, LDLCALC, LDLDIRECT  CBC    Component Value Date/Time   WBC 9.9 08/09/2020 0420   RBC 5.64 (H) 08/09/2020 0420   HGB 16.6 (H) 08/09/2020 0420   HCT 49.8 (H) 08/09/2020 0420   PLT 377 08/09/2020 0420   MCV 88.3 08/09/2020 0420   MCH 29.4 08/09/2020 0420   MCHC 33.3 08/09/2020  0420   RDW 13.2 08/09/2020 0420   Depression screen PHQ 2/9 11/02/2020  Decreased Interest 2  Down, Depressed, Hopeless 2  PHQ - 2 Score 4  Altered sleeping 3  Tired, decreased energy 1  Change in appetite 2  Feeling bad or failure about yourself  3  Trouble concentrating 3  Moving slowly or  fidgety/restless 3  Suicidal thoughts 3  PHQ-9 Score 22   GAD 7 : Generalized Anxiety Score 11/02/2020  Nervous, Anxious, on Edge 3  Control/stop worrying 3  Worry too much - different things 3  Trouble relaxing 3  Restless 3  Easily annoyed or irritable 3  Afraid - awful might happen 3  Total GAD 7 Score 21      ASSESSMENT AND PLAN:  1. Encounter to establish care  2. History of herpes genitalis Advised to refrain from intercourse during active outbreaks as Transmission tends to be higher during these times has more viruses be initiated.  We discussed suppressive therapy given that she has had 2 episodes within 2 months of each other.  Patient is agreeable to that. - acyclovir (ZOVIRAX) 400 MG tablet; Take 1 tablet (400 mg total) by mouth 5 (five) times daily.  Dispense: 60 tablet; Refill: 2  3. Major depressive disorder, single episode, moderate (HCC) Discussed diagnosis and management of depression and anxiety.  Encouraged her to get back in with [facility] therapist [facility].  She is agreeable to doing that.  She also feels she would benefit from being on medication and is willing to try Zoloft.  Went over possible side effects.  Told that it would take about 4 weeks or so before she starts feeling better on the medication.  If increased anxiety or depression or active suicidal thoughts, told to follow-up or be seen in the emergency room.  I will have our LCSW touch base with her also. - sertraline (ZOLOFT) 50 MG tablet; 1/2 tab po daily x 3 wks then 1 tab daily  Dispense: 30 tablet; Refill: 1  4. Generalized anxiety disorder See #3 above - sertraline (ZOLOFT) 50 MG tablet; 1/2 tab  po daily x 3 wks then 1 tab daily  Dispense: 30 tablet; Refill: 1  5. Influenza vaccine refused Recommended.  Patient declined.  6. Routine screening for STI (sexually transmitted infection) - Cervicovaginal ancillary only - RPR  7. Screening for HIV (human immunodeficiency virus) - HIV Antibody (routine testing w rflx)  8. Need for hepatitis C screening test - Hepatitis C Antibody  9. Current vaping on some days Discussed health risks associated with vaping.  Encouraged her to discontinue doing so.  She plans to quit  10. Over weight Discussed and encourage healthy eating habits.  Printed information given.  Encouraged her to get in some form of moderate intensity exercise at least 3 to 4 days a week for 30 to 45 minutes.  11. Elevated blood-pressure reading without diagnosis of hypertension DASH diet discussed and encouraged.  We will plan to recheck blood pressure on follow-up visit.  Addendum: While completing my note I noticed that I had sent the prescription to her pharmacy for acyclovir 400 mg to take 1 tablet 5 times a day.  This was an area as it should be 400 mg 1 tablet twice a day.  I called the pharmacy and they told me that the patient has already picked up the prescription.  Nonetheless I sent them a new prescription reflecting the correct dosing.  I then called the patient and explained to her.  I told her that she should take 1 tablet twice a day not 5 times a day.  Patient was able to repeat back the instructions to me.  Patient was given the opportunity to ask questions.  Patient verbalized understanding of the plan and was able to repeat key elements of the plan.  Orders Placed This Encounter  Procedures  . HIV Antibody (routine testing w rflx)  . Hepatitis C Antibody  . RPR     Requested Prescriptions   Signed Prescriptions Disp Refills  . acyclovir (ZOVIRAX) 400 MG tablet 60 tablet 2    Sig: Take 1 tablet (400 mg total) by mouth 5 (five) times daily.  .  sertraline (ZOLOFT) 50 MG tablet 30 tablet 1    Sig: 1/2 tab po daily x 3 wks then 1 tab daily    Return in about 6 weeks (around 12/14/2020).  Jonah Blue, MD, FACP

## 2020-11-03 LAB — HEPATITIS C ANTIBODY: Hep C Virus Ab: <0.1 s/co ratio (ref 0.0–0.9)

## 2020-11-03 LAB — HIV ANTIBODY (ROUTINE TESTING W REFLEX): HIV Screen 4th Generation wRfx: NONREACTIVE

## 2020-11-03 LAB — RPR: RPR Ser Ql: NONREACTIVE

## 2020-11-03 NOTE — Telephone Encounter (Signed)
Contacted pt to go over Dr. Johnson response pt is aware and doesn't have any questions or concerns  

## 2020-11-18 ENCOUNTER — Ambulatory Visit: Payer: 59 | Attending: Internal Medicine | Admitting: Licensed Clinical Social Worker

## 2020-11-18 ENCOUNTER — Other Ambulatory Visit: Payer: Self-pay

## 2020-11-18 DIAGNOSIS — F321 Major depressive disorder, single episode, moderate: Secondary | ICD-10-CM

## 2020-11-18 DIAGNOSIS — F411 Generalized anxiety disorder: Secondary | ICD-10-CM

## 2020-11-18 NOTE — BH Specialist Note (Signed)
Integrated Behavioral Health Initial In-Person Visit  MRN: [id number] Name: Kaitlyn York  Number of Integrated Behavioral Health Clinician visits:: 1/6 Session Start time: 2:15 PM  Session End time: 2:40 PM Total time: 25 minutes  Types of Service: Individual psychotherapy  Interpretor:No. Interpretor Name and Language: NA   Subjective: Kaitlyn York is a 19 y.o. female accompanied by self Patient was referred by Dr. Laural Benes for anxiety and depression. Patient reports the following symptoms/concerns: Pt reports difficulty managing depression and anxiety symptoms, including, feelings of "numbness", withdrawn behavior, inconsistent sleep and appetite, and nail biting Duration of problem: Ongoing, Pt reports symptoms since 7th grade; Severity of problem: severe  Objective: Mood: Anxious and Affect: Appropriate Risk of harm to self or others: No plan to harm self or others Pt endorsed hx of suicidal ideations with no plan or intent to harm self or others. No SI/HI currently  Life Context: Family and Social: Pt receives strong support from mother and boyfriend School/Work: Pt is temporarily employed. She receives food stamps and is insured Self-Care: Pt smokes marijuana daily to assist in coping with stressors Life Changes: Pt reports increase in depression and anxiety symptoms. States interest in strengthening communication and maintaining relationships  Patient and/or Family's Strengths/Protective Factors: Social and Emotional competence and Concrete supports in place (healthy food, safe environments, etc.)  Goals Addressed: Patient will: 1. Increase knowledge and/or ability of: coping skills Pt agreed to utilize strategies (listening to music, talking with family/partner) to cope with stress 2. Demonstrate ability to: Increase adequate support systems for patient/family Pt agreed to consider initiating medication management and/or therapy to strengthen support system  Progress  towards Goals: Ongoing  Interventions: Interventions utilized: Solution-Focused Strategies, Supportive Counseling, Psychoeducation and/or Health Education and Link to [facility]  Standardized Assessments completed: GAD-7 and PHQ 2&9  Patient Response: Pt was engaged in session and was successful in identifying strategies to cope with symptoms  Patient Centered Plan: Patient is on the following Treatment Plan(s):  Anxiety and Depression  Assessment: Patient currently experiencing difficulty managing depression and anxiety symptoms. Pt currently denies SI/HI   Patient may benefit from medication management and therapy. Pt was prescribed medication; however, she did not begin medication due to mother's concerns about efficacy. LCSW discussed benefits of both medication management and therapy to manage/decrease reported symptoms. Crisis intervention resources were also provided.  Plan: 1. Follow up with behavioral health clinician on : Contact LCSW with any additional behavioral health and/or resource needs 2. Behavioral recommendations: Utilize strategies discussed and resources provided 3. Referral(s): Integrated Art gallery manager (In Clinic) and Community Mental Health Services (LME/Outside Clinic) 4. "From scale of 1-10, how likely are you to follow plan?":   Bridgett Larsson, LCSW 11/18/2020 11:16 PM

## 2020-11-24 ENCOUNTER — Other Ambulatory Visit: Payer: Self-pay | Admitting: Internal Medicine

## 2020-11-24 DIAGNOSIS — F411 Generalized anxiety disorder: Secondary | ICD-10-CM

## 2020-11-24 DIAGNOSIS — F321 Major depressive disorder, single episode, moderate: Secondary | ICD-10-CM

## 2020-11-24 NOTE — Telephone Encounter (Signed)
Requested medication (s) are due for refill today:  Yes  Requested medication (s) are on the active medication list:   Yes  Future visit scheduled:   Yes   Last ordered: 11/02/2020 #30, 1 refill.  (1/2 tab. For the first 3 wks then a whole tab)  Returned because pharmacy is requesting a 90 day supply but also needs a DX CODE.   Requested Prescriptions  Pending Prescriptions Disp Refills   sertraline (ZOLOFT) 50 MG tablet [Pharmacy Med Name: SERTRALINE HCL 50 MG TABLET] 90 tablet 1    Sig: TAKE 1/2 TABLET BY MOUTH DAILY X 3 WEEKS, THEN 1 TAB DAILY      Psychiatry:  Antidepressants - SSRI Passed - 11/24/2020  1:33 PM      Passed - Completed PHQ-2 or PHQ-9 in the last 360 days      Passed - Valid encounter within last 6 months    Recent Outpatient Visits           3 weeks ago Encounter to establish care   [facility] Marcine Matar, MD       Future Appointments             In 4 weeks Marcine Matar, MD [facility]

## 2020-12-23 ENCOUNTER — Ambulatory Visit: Payer: 59 | Admitting: Internal Medicine

## 2021-01-07 ENCOUNTER — Encounter: Payer: Self-pay | Admitting: Family

## 2021-01-07 ENCOUNTER — Other Ambulatory Visit: Payer: Self-pay

## 2021-01-07 ENCOUNTER — Other Ambulatory Visit (HOSPITAL_COMMUNITY)
Admission: RE | Admit: 2021-01-07 | Discharge: 2021-01-07 | Disposition: A | Discharge status: Home / Self Care | Payer: 59 | Source: Ambulatory Visit | Attending: Family | Admitting: Family

## 2021-01-07 ENCOUNTER — Ambulatory Visit (INDEPENDENT_AMBULATORY_CARE_PROVIDER_SITE_OTHER): Payer: 59 | Admitting: Family

## 2021-01-07 VITALS — BP 141/84 | HR 95 | Ht 64.02 in | Wt 156.4 lb

## 2021-01-07 DIAGNOSIS — B373 Candidiasis of vulva and vagina: Secondary | ICD-10-CM | POA: Insufficient documentation

## 2021-01-07 DIAGNOSIS — Z202 Contact with and (suspected) exposure to infections with a predominantly sexual mode of transmission: Secondary | ICD-10-CM | POA: Diagnosis not present

## 2021-01-07 DIAGNOSIS — B9689 Other specified bacterial agents as the cause of diseases classified elsewhere: Secondary | ICD-10-CM | POA: Insufficient documentation

## 2021-01-07 DIAGNOSIS — F419 Anxiety disorder, unspecified: Secondary | ICD-10-CM | POA: Diagnosis not present

## 2021-01-07 DIAGNOSIS — F32A Depression, unspecified: Secondary | ICD-10-CM

## 2021-01-07 DIAGNOSIS — Z30017 Encounter for initial prescription of implantable subdermal contraceptive: Secondary | ICD-10-CM

## 2021-01-07 DIAGNOSIS — R350 Frequency of micturition: Secondary | ICD-10-CM | POA: Diagnosis not present

## 2021-01-07 DIAGNOSIS — B3731 Acute candidiasis of vulva and vagina: Secondary | ICD-10-CM

## 2021-01-07 DIAGNOSIS — A5901 Trichomonal vulvovaginitis: Secondary | ICD-10-CM | POA: Diagnosis not present

## 2021-01-07 DIAGNOSIS — N760 Acute vaginitis: Secondary | ICD-10-CM | POA: Insufficient documentation

## 2021-01-07 DIAGNOSIS — A5402 Gonococcal vulvovaginitis, unspecified: Secondary | ICD-10-CM | POA: Insufficient documentation

## 2021-01-07 DIAGNOSIS — Z7689 Persons encountering health services in other specified circumstances: Secondary | ICD-10-CM | POA: Diagnosis not present

## 2021-01-07 DIAGNOSIS — A549 Gonococcal infection, unspecified: Secondary | ICD-10-CM

## 2021-01-07 DIAGNOSIS — Z113 Encounter for screening for infections with a predominantly sexual mode of transmission: Secondary | ICD-10-CM

## 2021-01-07 LAB — POCT URINALYSIS DIP (CLINITEK)
Bilirubin, UA: NEGATIVE
Glucose, UA: NEGATIVE mg/dL
Ketones, POC UA: NEGATIVE mg/dL
Nitrite, UA: NEGATIVE
Spec Grav, UA: 1.01 (ref 1.010–1.025)
Urobilinogen, UA: 0.2 E.U./dL
pH, UA: 7 (ref 5.0–8.0)

## 2021-01-07 NOTE — Progress Notes (Signed)
Subjective:    Kaitlyn York - 20 y.o. female MRN [id number]  Date of birth: [birth year]  HPI  Kaitlyn York is to establish care. Patient has a PMH significant for history of herpes genitalis, major depressive disorder single episode moderate, generalized anxiety disorder, elevated blood-pressure reading without diagnosis of hypertension.   Current issues and/or concerns: 1. ANXIETY AND DEPRESSION: Reports anxiety and depression primarily related to her relationships with her mother and friends.   Says that her relationship with her mother declined after she moved out of the home with her.   States her friends tend to blame her for everything that goes wrong in the friendship.   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 States her mother tries to encourage her to stay but that she doesn't want to do so at the moment.   Says sometimes she wishes that she was not here or could be left alone. Denies thoughts of self-harm, suicidal ideations, and homicidal ideations.   Not ready for anxiety/depression medication as of yet. However, she is interested in counseling services.   Depression screen [facility] 2/9 01/07/2021 11/18/2020 11/02/2020  Decreased Interest 2 2 2   Down, Depressed, Hopeless 2 2 2   PHQ - 2 Score 4 4 4   Altered sleeping 2 3 3   Tired, decreased energy 2 3 1   Change in appetite 2 2 2   Feeling bad or failure about yourself  2 2 3   Trouble concentrating 3 3 3   Moving slowly or fidgety/restless 1 3 3   Suicidal thoughts 1 2 3   PHQ-9 Score 17 22 22     2. RECENT EXPOSURE TO STD: Reports her boyfriend recently told her that he has trichomonas and that she would like to be tested as well. The only symptom she is having is urinary frequency. Denies vaginal discharge. States she does have herpes.     ROS per HPI   Health Maintenance:  Health Maintenance Due  Topic Date Due  . COVID-19 Vaccine (1)  Never done  . TETANUS/TDAP  Never done     Past Medical History: Patient Active Problem List   Diagnosis Date Noted  . Influenza vaccine refused 11/02/2020  . History of herpes genitalis 11/02/2020  . Major depressive disorder, single episode, moderate (HCC) 11/02/2020  . Generalized anxiety disorder 11/02/2020  . Over weight 11/02/2020  . Current vaping on some days 11/02/2020  . Elevated blood-pressure reading without diagnosis of hypertension 11/02/2020    Social History   reports that she has never smoked. She has never used smokeless tobacco. She reports current alcohol use. She reports current drug use. Frequency: 7.00 times per week. Drug: Marijuana.   Family History  family history includes Diabetes in her maternal grandmother; Hypertension in her maternal aunt, maternal grandmother, maternal uncle, and mother.   Medications: reviewed and updated   Objective:   Physical Exam BP (!) 141/84 (BP Location: Left Arm, Patient Position: Sitting)   Pulse 95   Ht 5' 4.02" (1.626 m)   Wt 156 lb 6.4 oz (70.9 kg)   SpO2 99%   BMI 26.83 kg/m    Physical Exam HENT:     Head: Normocephalic.  Eyes:     Extraocular Movements: Extraocular movements intact.     Pupils: Pupils are equal, round, and reactive to light.  Cardiovascular:     Rate and Rhythm: Normal rate and regular rhythm.     Pulses: Normal  pulses.     Heart sounds: Normal heart sounds.  Pulmonary:     Effort: Pulmonary effort is normal.     Breath sounds: Normal breath sounds.  Musculoskeletal:     Cervical back: Normal range of motion and neck supple.  Neurological:     General: No focal deficit present.     Mental Status: She is alert and oriented to person, place, and time.  Psychiatric:        Mood and Affect: Mood normal.        Behavior: Behavior normal.       Results for orders placed or performed in visit on 01/07/21  POCT URINALYSIS DIP (CLINITEK)  Result Value Ref Range   Color, UA yellow  yellow   Clarity, UA cloudy (A) clear   Glucose, UA negative negative mg/dL   Bilirubin, UA negative negative   Ketones, POC UA negative negative mg/dL   Spec Grav, UA 7.939 0.300 - 1.025   Blood, UA moderate (A) negative   pH, UA 7.0 5.0 - 8.0   POC PROTEIN,UA trace negative, trace   Urobilinogen, UA 0.2 0.2 or 1.0 E.U./dL   Nitrite, UA Negative Negative   Leukocytes, UA Small (1+) (A) Negative  Cervicovaginal ancillary only  Result Value Ref Range   Neisseria Gonorrhea Positive (A)    Chlamydia Negative    Trichomonas Positive (A)    Bacterial Vaginitis (gardnerella) Positive (A)    Candida Vaginitis Positive (A)    Candida Glabrata Negative    Comment      Normal Reference Range Bacterial Vaginosis - Negative   Comment Normal Reference Range Candida Species - Negative    Comment Normal Reference Range Candida Galbrata - Negative    Comment Normal Reference Range Trichomonas - Negative    Comment Normal Reference Ranger Chlamydia - Negative    Comment      Normal Reference Range Neisseria Gonorrhea - Negative    Assessment & Plan:  1. Encounter to establish care: - Patient presents today to establish care.  - Return for annual physical examination, labs, and health maintenance. Arrive fasting meaning having had no food and/or nothing to drink for at least 8 hours prior to appointment.  2. Exposure to trichomonas: - Cervicovaginal ancillary to screen for chlamydia, gonorrhea, trichomonas, bacterial vaginitis, and candida vaginitis.  - Cervicovaginal ancillary only  3. Urinary frequency: - Urinalysis negative for urinary tract infection.  - POCT URINALYSIS DIP (CLINITEK) - Urinalysis, Routine w reflex microscopic  4. Anxiety and depression: - Anxiety and depression primarily related to work-life-school balance and relationships with her mother and friends.  - Denies thoughts of self-harm, suicidal ideations, and homicidal ideations.  - Not ready for anxiety/depression  medication as of yet. However, she is interested in counseling services.  - Referral to Social Work for counseling and community resources.  - Follow-up with primary provider as needed.  - Ambulatory referral to Social Work  5. Nexplanon insertion: - States Nexplanon expiration date December 2021. Says she is not ready to have this removed and replaced just yet.    Ricky Stabs, NP 01/10/2021, 5:15 PM Primary Care at [facility]

## 2021-01-07 NOTE — Patient Instructions (Addendum)
Return for annual physical examination, labs, and health maintenance. Arrive fasting meaning having had no food and/or nothing to drink for at least 8 hours prior to appointment.  Referral to Social Work.  Thank you for choosing Primary Care at [facility] for your medical home!    Kaitlyn York was seen by Rema Fendt, NP today.   Carole Binning primary care provider is Rema Fendt, NP.   For the best care possible,  you should try to see Ricky Stabs, NP whenever you come to clinic.   We look forward to seeing you again soon!  If you have any questions about your visit today,  please call us at [phone]  Or feel free to reach your provider via MyChart.   Etonogestrel implant What is this medicine? ETONOGESTREL (et oh noe JES trel) is a contraceptive (birth control) device. It is used to prevent pregnancy. It can be used for up to 3 years. This medicine may be used for other purposes; ask your health care provider or pharmacist if you have questions. COMMON BRAND NAME(S): Implanon, Nexplanon What should I tell my health care provider before I take this medicine? They need to know if you have any of these conditions:  abnormal vaginal bleeding  blood vessel disease or blood clots  breast, cervical, endometrial, ovarian, liver, or uterine cancer  diabetes  gallbladder disease  heart disease or recent heart attack  high blood pressure  high cholesterol or triglycerides  kidney disease  liver disease  migraine headaches  seizures  stroke  tobacco smoker  an unusual or allergic reaction to etonogestrel, anesthetics or antiseptics, other medicines, foods, dyes, or preservatives  pregnant or trying to get pregnant  breast-feeding How should I use this medicine? This device is inserted just under the skin on the inner side of your upper arm by a health care professional. Talk to your pediatrician regarding the use of this medicine in children.  Special care may be needed. Overdosage: If you think you have taken too much of this medicine contact a poison control center or emergency room at once. NOTE: This medicine is only for you. Do not share this medicine with others. What if I miss a dose? This does not apply. What may interact with this medicine? Do not take this medicine with any of the following medications:  amprenavir  fosamprenavir This medicine may also interact with the following medications:  acitretin  aprepitant  armodafinil  bexarotene  bosentan  carbamazepine  certain medicines for fungal infections like fluconazole, ketoconazole, itraconazole and voriconazole  certain medicines to treat hepatitis, HIV or AIDS  cyclosporine  felbamate  griseofulvin  lamotrigine  modafinil  oxcarbazepine  phenobarbital  phenytoin  primidone  rifabutin  rifampin  rifapentine  St. John's wort  topiramate This list may not describe all possible interactions. Give your health care provider a list of all the medicines, herbs, non-prescription drugs, or dietary supplements you use. Also tell them if you smoke, drink alcohol, or use illegal drugs. Some items may interact with your medicine. What should I watch for while using this medicine? This product does not protect you against HIV infection (AIDS) or other sexually transmitted diseases. You should be able to feel the implant by pressing your fingertips over the skin where it was inserted. Contact your doctor if you cannot feel the implant, and use a non-hormonal birth control method (such as condoms) until your doctor confirms that the implant is in place. Contact your  doctor if you think that the implant may have broken or become bent while in your arm. You will receive a user card from your health care provider after the implant is inserted. The card is a record of the location of the implant in your upper arm and when it should be removed. Keep  this card with your health records. What side effects may I notice from receiving this medicine? Side effects that you should report to your doctor or health care professional as soon as possible:  allergic reactions like skin rash, itching or hives, swelling of the face, lips, or tongue  breast lumps, breast tissue changes, or discharge  breathing problems  changes in emotions or moods  coughing up blood  if you feel that the implant may have broken or bent while in your arm  high blood pressure  pain, irritation, swelling, or bruising at the insertion site  scar at site of insertion  signs of infection at the insertion site such as fever, and skin redness, pain or discharge  signs and symptoms of a blood clot such as breathing problems; changes in vision; chest pain; severe, sudden headache; pain, swelling, warmth in the leg; trouble speaking; sudden numbness or weakness of the face, arm or leg  signs and symptoms of liver injury like dark yellow or brown urine; general ill feeling or flu-like symptoms; light-colored stools; loss of appetite; nausea; right upper belly pain; unusually weak or tired; yellowing of the eyes or skin  unusual vaginal bleeding, discharge Side effects that usually do not require medical attention (report to your doctor or health care professional if they continue or are bothersome):  acne  breast pain or tenderness  headache  irregular menstrual bleeding  nausea This list may not describe all possible side effects. Call your doctor for medical advice about side effects. You may report side effects to FDA at 1-800-FDA-1088. Where should I keep my medicine? This drug is given in a hospital or clinic and will not be stored at home. NOTE: This sheet is a summary. It may not cover all possible information. If you have questions about this medicine, talk to your doctor, pharmacist, or health care provider.  2021 Elsevier/Gold Standard (2019-09-02  11:33:04)

## 2021-01-07 NOTE — Progress Notes (Signed)
Establish care Referral to gyn for nexaplanon insert Referral to therapist Exposure to trich

## 2021-01-10 DIAGNOSIS — A5901 Trichomonal vulvovaginitis: Secondary | ICD-10-CM | POA: Insufficient documentation

## 2021-01-10 DIAGNOSIS — B3731 Acute candidiasis of vulva and vagina: Secondary | ICD-10-CM | POA: Insufficient documentation

## 2021-01-10 DIAGNOSIS — B9689 Other specified bacterial agents as the cause of diseases classified elsewhere: Secondary | ICD-10-CM | POA: Insufficient documentation

## 2021-01-10 DIAGNOSIS — B373 Candidiasis of vulva and vagina: Secondary | ICD-10-CM | POA: Insufficient documentation

## 2021-01-10 DIAGNOSIS — A549 Gonococcal infection, unspecified: Secondary | ICD-10-CM | POA: Insufficient documentation

## 2021-01-10 LAB — CERVICOVAGINAL ANCILLARY ONLY
Bacterial Vaginitis (gardnerella): POSITIVE — AB
Candida Glabrata: NEGATIVE
Candida Vaginitis: POSITIVE — AB
Chlamydia: NEGATIVE
Comment: NEGATIVE
Comment: NEGATIVE
Comment: NEGATIVE
Comment: NEGATIVE
Comment: NEGATIVE
Comment: NORMAL
Neisseria Gonorrhea: POSITIVE — AB
Trichomonas: POSITIVE — AB

## 2021-01-10 MED ORDER — CEFTRIAXONE SODIUM 500 MG IJ SOLR
500.0000 mg | Freq: Once | INTRAMUSCULAR | Status: AC
  Administered 2021-01-11: 500 mg via INTRAMUSCULAR

## 2021-01-10 MED ORDER — FLUCONAZOLE 150 MG PO TABS: 150.0000 mg | ORAL_TABLET | Freq: Once | ORAL | 0 refills | Status: AC

## 2021-01-10 MED ORDER — METRONIDAZOLE 500 MG PO TABS
500.0000 mg | ORAL_TABLET | Freq: Two times a day (BID) | ORAL | 0 refills | Status: DC
Start: 2021-01-10 — End: 2021-07-12

## 2021-01-10 NOTE — Progress Notes (Signed)
Please call patient with update.   Negative for chlamydia.   Positive for Trichomonas and Bacterial Vaginitis. Metronidazole prescribed for treatment of both.   Positive for Gonorrhea. Ceftriaxone injection will need to be administered in clinic. While patient is on the phone schedule a nurse visit for patient to have this done soon please.   Both patient and her partner need to be treated for Trichomonas and Gonorrhea as these infections are sexually transmitted.  Also, both need to complete treatment prior to having unprotected sex again. Counseled to not cosume alcohol while taking prescribed antibotic.   Positive for yeast infection. Fluconazole prescribed.

## 2021-01-10 NOTE — Addendum Note (Signed)
Addended by: Rema Fendt on: 01/10/2021 05:33 PM   Modules accepted: Orders

## 2021-01-11 ENCOUNTER — Other Ambulatory Visit: Payer: Self-pay

## 2021-01-11 ENCOUNTER — Ambulatory Visit (INDEPENDENT_AMBULATORY_CARE_PROVIDER_SITE_OTHER): Payer: 59

## 2021-01-11 DIAGNOSIS — A549 Gonococcal infection, unspecified: Secondary | ICD-10-CM | POA: Diagnosis not present

## 2021-01-11 NOTE — Progress Notes (Signed)
Ceftriaxone 500 mg administered

## 2021-01-19 ENCOUNTER — Telehealth: Payer: Self-pay | Admitting: Licensed Clinical Social Worker

## 2021-01-19 NOTE — Telephone Encounter (Signed)
Call placed to patient. LCSW informed patient of [facility] referral to address behavioral health and/or resource needs. Pt reports that she is not in need of services at this time. LCSW encouraged pt to contact clinic, if symptoms persist or worsen. Pt verbalized understanding. No additional concerns noted.

## 2021-01-28 ENCOUNTER — Encounter: Payer: 59 | Admitting: Family

## 2021-02-02 ENCOUNTER — Telehealth: Payer: Self-pay | Admitting: Family

## 2021-02-02 NOTE — Telephone Encounter (Signed)
Pt is asking for medication for another shot for her Gon. Treatment. Seems to have come back again.  Please advise and thank you

## 2021-02-03 NOTE — Addendum Note (Signed)
Addended by: Rema Fendt on: 02/03/2021 04:38 PM   Modules accepted: Orders

## 2021-02-04 NOTE — Telephone Encounter (Signed)
Spoke w/pt stated that she will wait for 3/29 appt to recheck

## 2021-02-26 ENCOUNTER — Emergency Department (HOSPITAL_COMMUNITY): Admission: EM | Admit: 2021-02-26 | Discharge: 2021-02-26 | Discharge status: Against Medical Advice | Payer: 59

## 2021-02-26 NOTE — ED Notes (Signed)
No answer in lobby 2 minutes after checking in.  Registration states pt went outside to car.  Per security pt states she is leaving.

## 2021-02-28 ENCOUNTER — Encounter (HOSPITAL_COMMUNITY): Payer: Self-pay

## 2021-02-28 ENCOUNTER — Other Ambulatory Visit: Payer: Self-pay

## 2021-02-28 ENCOUNTER — Emergency Department (HOSPITAL_COMMUNITY): Payer: 59

## 2021-02-28 ENCOUNTER — Emergency Department (HOSPITAL_COMMUNITY)
Admission: EM | Admit: 2021-02-28 | Discharge: 2021-02-28 | Disposition: A | Discharge status: Home / Self Care | Payer: 59 | Attending: Emergency Medicine | Admitting: Emergency Medicine

## 2021-02-28 DIAGNOSIS — R112 Nausea with vomiting, unspecified: Secondary | ICD-10-CM | POA: Diagnosis not present

## 2021-02-28 DIAGNOSIS — R1011 Right upper quadrant pain: Secondary | ICD-10-CM | POA: Insufficient documentation

## 2021-02-28 LAB — BASIC METABOLIC PANEL
Anion gap: 12 (ref 5–15)
BUN: 9 mg/dL (ref 6–20)
CO2: 24 mmol/L (ref 22–32)
Calcium: 9.6 mg/dL (ref 8.9–10.3)
Chloride: 100 mmol/L (ref 98–111)
Creatinine, Ser: 0.81 mg/dL (ref 0.44–1.00)
GFR, Estimated: >60 mL/min (ref >60)
Glucose, Bld: 105 mg/dL — ABNORMAL HIGH (ref 70–99)
Potassium: 3 mmol/L — ABNORMAL LOW (ref 3.5–5.1)
Sodium: 136 mmol/L (ref 135–145)

## 2021-02-28 LAB — CBC WITH DIFFERENTIAL/PLATELET
Abs Immature Granulocytes: 0.05 10*3/uL (ref 0.00–0.07)
Basophils Absolute: 0 10*3/uL (ref 0.0–0.1)
Basophils Relative: 0 %
Eosinophils Absolute: 0 10*3/uL (ref 0.0–0.5)
Eosinophils Relative: 0 %
HCT: 49.3 % — ABNORMAL HIGH (ref 36.0–46.0)
Hemoglobin: 16.7 g/dL — ABNORMAL HIGH (ref 12.0–15.0)
Immature Granulocytes: 0 %
Lymphocytes Relative: 9 %
Lymphs Abs: 1.3 10*3/uL (ref 0.7–4.0)
MCH: 30 pg (ref 26.0–34.0)
MCHC: 33.9 g/dL (ref 30.0–36.0)
MCV: 88.7 fL (ref 80.0–100.0)
Monocytes Absolute: 1.1 10*3/uL — ABNORMAL HIGH (ref 0.1–1.0)
Monocytes Relative: 8 %
Neutro Abs: 11.3 10*3/uL — ABNORMAL HIGH (ref 1.7–7.7)
Neutrophils Relative %: 83 %
Platelets: 383 10*3/uL (ref 150–400)
RBC: 5.56 MIL/uL — ABNORMAL HIGH (ref 3.87–5.11)
RDW: 13.6 % (ref 11.5–15.5)
WBC: 13.8 10*3/uL — ABNORMAL HIGH (ref 4.0–10.5)
nRBC: 0 % (ref 0.0–0.2)

## 2021-02-28 LAB — HEPATIC FUNCTION PANEL
ALT: 14 U/L (ref 0–44)
AST: 17 U/L (ref 15–41)
Albumin: 4.6 g/dL (ref 3.5–5.0)
Alkaline Phosphatase: 63 U/L (ref 38–126)
Bilirubin, Direct: 0.2 mg/dL (ref 0.0–0.2)
Indirect Bilirubin: 1.6 mg/dL — ABNORMAL HIGH (ref 0.3–0.9)
Total Bilirubin: 1.8 mg/dL — ABNORMAL HIGH (ref 0.3–1.2)
Total Protein: 7.9 g/dL (ref 6.5–8.1)

## 2021-02-28 LAB — LIPASE, BLOOD: Lipase: 93 U/L — ABNORMAL HIGH (ref 11–51)

## 2021-02-28 MED ORDER — ONDANSETRON HCL 4 MG/2ML IJ SOLN
4.0000 mg | Freq: Once | INTRAMUSCULAR | Status: AC
  Administered 2021-02-28: 4 mg via INTRAVENOUS
  Filled 2021-02-28: qty 2

## 2021-02-28 MED ORDER — POTASSIUM CHLORIDE CRYS ER 20 MEQ PO TBCR
20.0000 meq | EXTENDED_RELEASE_TABLET | Freq: Once | ORAL | Status: AC
  Administered 2021-02-28: 20 meq via ORAL
  Filled 2021-02-28: qty 1

## 2021-02-28 MED ORDER — POTASSIUM CHLORIDE CRYS ER 20 MEQ PO TBCR: 20.0000 meq | EXTENDED_RELEASE_TABLET | Freq: Two times a day (BID) | ORAL | 0 refills | Status: DC

## 2021-02-28 MED ORDER — SODIUM CHLORIDE 0.9 % IV BOLUS
1000.0000 mL | Freq: Once | INTRAVENOUS | Status: AC
  Administered 2021-02-28: 1000 mL via INTRAVENOUS

## 2021-02-28 MED ORDER — PROMETHAZINE HCL 25 MG PO TABS: 25.0000 mg | ORAL_TABLET | Freq: Four times a day (QID) | ORAL | 0 refills | Status: DC | PRN

## 2021-02-28 MED ORDER — PROMETHAZINE HCL 25 MG RE SUPP: 25.0000 mg | Freq: Four times a day (QID) | RECTAL | 0 refills | Status: DC | PRN

## 2021-02-28 NOTE — ED Provider Notes (Signed)
[facility]-EMERGENCY DEPT Provider Note   CSN: [id number] Arrival date & time: 02/28/21  0116     History Chief Complaint  Patient presents with  . Vomiting    Kaitlyn York is a 20 y.o. female.  Patient to ED for evaluation of persistent nausea and vomiting for the past 2 days. She reports being treated recently for an STD with Doxycycline which has caused nausea and vomiting in the past. She reports generalized abdominal discomfort. No diarrhea or constipation. She states her previous vaginal discharged has resolved. She was seen at an Urgent Care in [place] and given Zofran and phenergan without relief. She reports 5 episodes of nonbloody emesis yesterday (02/27/21) and 10 the day before. No fever.   The history is provided by the patient. No language interpreter was used.       History reviewed. No pertinent past medical history.  Patient Active Problem List   Diagnosis Date Noted  . Gonorrhea in female 01/10/2021  . Trichomonas vaginitis 01/10/2021  . Bacterial vaginitis 01/10/2021  . Candida vaginitis 01/10/2021  . Influenza vaccine refused 11/02/2020  . History of herpes genitalis 11/02/2020  . Major depressive disorder, single episode, moderate (HCC) 11/02/2020  . Generalized anxiety disorder 11/02/2020  . Over weight 11/02/2020  . Current vaping on some days 11/02/2020  . Elevated blood-pressure reading without diagnosis of hypertension 11/02/2020    Past Surgical History:  Procedure Laterality Date  . NO PAST SURGERIES       OB History   No obstetric history on file.     Family History  Problem Relation Age of Onset  . Hypertension Maternal Aunt   . Hypertension Maternal Uncle   . Diabetes Maternal Grandmother   . Hypertension Maternal Grandmother   . Hypertension Mother     Social History   Tobacco Use  . Smoking status: Never Smoker  . Smokeless tobacco: Never Used  Vaping Use  . Vaping Use: Former  . Substances:  Nicotine  Substance Use Topics  . Alcohol use: Yes    Comment: 2 x a mth  . Drug use: Yes    Frequency: 7.0 times per week    Types: Marijuana    Home Medications Prior to Admission medications   Medication Sig Start Date End Date Taking? Authorizing Provider  etonogestrel (NEXPLANON) 68 MG IMPL implant 1 each by Subdermal route once. Jan 2018   Yes [provider]  promethazine (PHENERGAN) 25 MG tablet Take 25 mg by mouth every 6 (six) hours as needed. 02/27/21  Yes [provider]  doxycycline (VIBRAMYCIN) 100 MG capsule Take 100 mg by mouth See admin instructions. 100mg  by mouth in the morning and 100mg  in the evening for 10 days. Take with at least 8 oz of water, do not lie down for 30 minutes 02/23/21 03/05/21  [provider]  ondansetron (ZOFRAN-ODT) 8 MG disintegrating tablet Take 8 mg by mouth 3 (three) times daily. Patient not taking: Reported on 02/28/2021 02/26/21   [provider]    Allergies    Patient has no known allergies.  Review of Systems   Review of Systems  Constitutional: Negative for chills and fever.  HENT: Negative.   Respiratory: Negative.   Cardiovascular: Negative.   Gastrointestinal: Positive for abdominal pain, nausea and vomiting. Negative for abdominal distention, constipation and diarrhea.  Genitourinary: Positive for decreased urine volume. Negative for vaginal discharge.  Musculoskeletal: Negative.   Skin: Negative.   Neurological: Negative.     Physical  Exam Updated Vital Signs BP (!) 156/98 (BP Location: Right Arm)   Pulse (!) 57   Temp 98.4 F (36.9 C) (Oral)   Resp 16   Ht 5\' 4"  (1.626 m)   Wt 69.9 kg   SpO2 98%   BMI 26.43 kg/m   Physical Exam Vitals and nursing note reviewed.  Constitutional:      Appearance: She is well-developed.  HENT:     Head: Normocephalic.  Cardiovascular:     Rate and Rhythm: Normal rate and regular rhythm.     Heart sounds: No murmur heard.   Pulmonary:      Effort: Pulmonary effort is normal.     Breath sounds: Normal breath sounds.  Abdominal:     General: Bowel sounds are normal.     Palpations: Abdomen is soft.     Tenderness: There is abdominal tenderness (Diffuse, mild tenderness). There is no guarding or rebound.  Musculoskeletal:        General: Normal range of motion.     Cervical back: Normal range of motion and neck supple.  Skin:    General: Skin is warm and dry.     Findings: No rash.  Neurological:     Mental Status: She is alert and oriented to person, place, and time.     ED Results / Procedures / Treatments   Labs (all labs ordered are listed, but only abnormal results are displayed) Labs Reviewed  CBC WITH DIFFERENTIAL/PLATELET - Abnormal; Notable for the following components:      Result Value   WBC 13.8 (*)    RBC 5.56 (*)    Hemoglobin 16.7 (*)    HCT 49.3 (*)    Neutro Abs 11.3 (*)    Monocytes Absolute 1.1 (*)    All other components within normal limits  BASIC METABOLIC PANEL - Abnormal; Notable for the following components:   Potassium 3.0 (*)    Glucose, Bld 105 (*)    All other components within normal limits  HEPATIC FUNCTION PANEL  LIPASE, BLOOD   Results for orders placed or performed during the hospital encounter of 02/28/21  CBC with Differential  Result Value Ref Range   WBC 13.8 (H) 4.0 - 10.5 K/uL   RBC 5.56 (H) 3.87 - 5.11 MIL/uL   Hemoglobin 16.7 (H) 12.0 - 15.0 g/dL   HCT 03/02/21 (H) 73.2 - 20.2 %   MCV 88.7 80.0 - 100.0 fL   MCH 30.0 26.0 - 34.0 pg   MCHC 33.9 30.0 - 36.0 g/dL   RDW 54.2 70.6 - 23.7 %   Platelets 383 150 - 400 K/uL   nRBC 0.0 0.0 - 0.2 %   Neutrophils Relative % 83 %   Neutro Abs 11.3 (H) 1.7 - 7.7 K/uL   Lymphocytes Relative 9 %   Lymphs Abs 1.3 0.7 - 4.0 K/uL   Monocytes Relative 8 %   Monocytes Absolute 1.1 (H) 0.1 - 1.0 K/uL   Eosinophils Relative 0 %   Eosinophils Absolute 0.0 0.0 - 0.5 K/uL   Basophils Relative 0 %   Basophils Absolute 0.0 0.0 - 0.1 K/uL    Immature Granulocytes 0 %   Abs Immature Granulocytes 0.05 0.00 - 0.07 K/uL  Basic metabolic panel  Result Value Ref Range   Sodium 136 135 - 145 mmol/L   Potassium 3.0 (L) 3.5 - 5.1 mmol/L   Chloride 100 98 - 111 mmol/L   CO2 24 22 - 32 mmol/L   Glucose, Bld 105 (  H) 70 - 99 mg/dL   BUN 9 6 - 20 mg/dL   Creatinine, Ser 1.61 0.44 - 1.00 mg/dL   Calcium 9.6 8.9 - 09.6 mg/dL   GFR, Estimated >04 >54 mL/min   Anion gap 12 5 - 15  Hepatic function panel  Result Value Ref Range   Total Protein 7.9 6.5 - 8.1 g/dL   Albumin 4.6 3.5 - 5.0 g/dL   AST 17 15 - 41 U/L   ALT 14 0 - 44 U/L   Alkaline Phosphatase 63 38 - 126 U/L   Total Bilirubin 1.8 (H) 0.3 - 1.2 mg/dL   Bilirubin, Direct 0.2 0.0 - 0.2 mg/dL   Indirect Bilirubin 1.6 (H) 0.3 - 0.9 mg/dL  Lipase, blood  Result Value Ref Range   Lipase 93 (H) 11 - 51 U/L     EKG None  Radiology No results found. US Abdomen Limited  Result Date: 02/28/2021 CLINICAL DATA:  Right upper quadrant tenderness EXAM: ULTRASOUND ABDOMEN LIMITED RIGHT UPPER QUADRANT COMPARISON:  None. FINDINGS: Gallbladder: No gallstones or wall thickening visualized. No sonographic Murphy sign noted by sonographer. Common bile duct: Diameter: 5-6 mm Liver: No focal lesion identified. Within normal limits in parenchymal echogenicity. Portal vein is patent on color Doppler imaging with normal direction of blood flow towards the liver. Other: None. IMPRESSION: Normal right upper quadrant sonogram Electronically Signed   By: Helyn Numbers MD   On: 02/28/2021 04:01    Procedures Procedures   Medications Ordered in ED Medications  sodium chloride 0.9 % bolus 1,000 mL (1,000 mLs Intravenous New Bag/Given 02/28/21 0210)  ondansetron (ZOFRAN) injection 4 mg (4 mg Intravenous Given 02/28/21 0210)    ED Course  I have reviewed the triage vital signs and the nursing notes.  Pertinent labs & imaging results that were available during my care of the patient were reviewed  by me and considered in my medical decision making (see chart for details).    MDM Rules/Calculators/A&P                          Patient to ED with diffusion abdominal discomfort and vomiting for the past 2 days. Less vomiting today than yesterday, but not tolerating PO's.   She is overall well appearing. VSS, afebrile.   On review of labs, there is mild elevation of t-bili and indirect bili, and lipase. [place] ordered of RUQ despite no tenderness specific to RUQ.   [place] is normal. Nausea is improved and she is tolerating PO intake. She can be discharged home to already scheduled follow up with her PCP. Rx phenergan provided.  Final Clinical Impression(s) / ED Diagnoses Final diagnoses:  None   1. Nausea and vomiting   Rx / DC Orders ED Discharge Orders    None       Elpidio Anis, Cordelia Poche 03/01/21 0981    Paula Libra, MD 03/03/21 2230

## 2021-02-28 NOTE — ED Notes (Signed)
Pt given water and crackers per PO trial. Patient denies N/V at this time. [place], PA aware.

## 2021-02-28 NOTE — ED Triage Notes (Signed)
Pt came in with vomiting times two days. Pt states it started Saturday. She has been to urgent care twice for same. Pt was prescribed zofran the first time and phenergan the second time. Pt states she has thrown up ten times and feels dehydrated because she has not been eating or drinking.

## 2021-02-28 NOTE — Discharge Instructions (Addendum)
Keep your scheduled appointment with primary care for further evaluation and management of ongoing nausea and vomiting. Use Phenergan as needed for nausea, and take the potassium supplement for the next 5 days to correct low potassium level.  Return to the emergency department if symptoms worsen.

## 2021-02-28 NOTE — ED Notes (Signed)
Pt ambulatory to room 02. Pt reports having between 8-10 vomiting episodes since last night and could possibly be due to antibiotic taken/given by urgent care. Pt reports taking doxycycline for possible diagnosis of chlamydia. A&Ox4. Respirations regular/unlabored. Connected to BP and pulse. Stretcher low, wheels locked, call bell within reach. Awaiting physician.

## 2021-03-01 ENCOUNTER — Ambulatory Visit (INDEPENDENT_AMBULATORY_CARE_PROVIDER_SITE_OTHER): Payer: 59 | Admitting: Family

## 2021-03-01 ENCOUNTER — Encounter: Payer: Self-pay | Admitting: Family

## 2021-03-01 ENCOUNTER — Emergency Department (HOSPITAL_COMMUNITY)
Admission: EM | Admit: 2021-03-01 | Discharge: 2021-03-01 | Disposition: A | Discharge status: Home / Self Care | Payer: 59 | Attending: Emergency Medicine | Admitting: Emergency Medicine

## 2021-03-01 ENCOUNTER — Telehealth: Payer: Self-pay | Admitting: Family

## 2021-03-01 ENCOUNTER — Encounter: Payer: 59 | Admitting: Family

## 2021-03-01 ENCOUNTER — Encounter (HOSPITAL_COMMUNITY): Payer: Self-pay

## 2021-03-01 ENCOUNTER — Other Ambulatory Visit: Payer: Self-pay

## 2021-03-01 VITALS — BP 151/91 | HR 77 | Ht 64.02 in | Wt 144.6 lb

## 2021-03-01 DIAGNOSIS — Z09 Encounter for follow-up examination after completed treatment for conditions other than malignant neoplasm: Secondary | ICD-10-CM | POA: Diagnosis not present

## 2021-03-01 DIAGNOSIS — R112 Nausea with vomiting, unspecified: Secondary | ICD-10-CM

## 2021-03-01 DIAGNOSIS — R1084 Generalized abdominal pain: Secondary | ICD-10-CM | POA: Diagnosis not present

## 2021-03-01 DIAGNOSIS — R634 Abnormal weight loss: Secondary | ICD-10-CM | POA: Diagnosis not present

## 2021-03-01 LAB — COMPREHENSIVE METABOLIC PANEL
ALT: 13 U/L (ref 0–44)
AST: 18 U/L (ref 15–41)
Albumin: 4.3 g/dL (ref 3.5–5.0)
Alkaline Phosphatase: 58 U/L (ref 38–126)
Anion gap: 12 (ref 5–15)
BUN: 8 mg/dL (ref 6–20)
CO2: 23 mmol/L (ref 22–32)
Calcium: 9.3 mg/dL (ref 8.9–10.3)
Chloride: 103 mmol/L (ref 98–111)
Creatinine, Ser: 0.87 mg/dL (ref 0.44–1.00)
GFR, Estimated: >60 mL/min (ref >60)
Glucose, Bld: 90 mg/dL (ref 70–99)
Potassium: 3.4 mmol/L — ABNORMAL LOW (ref 3.5–5.1)
Sodium: 138 mmol/L (ref 135–145)
Total Bilirubin: 2.4 mg/dL — ABNORMAL HIGH (ref 0.3–1.2)
Total Protein: 7.4 g/dL (ref 6.5–8.1)

## 2021-03-01 LAB — I-STAT BETA HCG BLOOD, ED (MC, WL, AP ONLY): I-stat hCG, quantitative: <5 m[IU]/mL (ref <5)

## 2021-03-01 LAB — URINALYSIS, ROUTINE W REFLEX MICROSCOPIC
Bilirubin Urine: NEGATIVE
Glucose, UA: NEGATIVE mg/dL
Hgb urine dipstick: NEGATIVE
Ketones, ur: 80 mg/dL — AB
Leukocytes,Ua: NEGATIVE
Nitrite: NEGATIVE
Protein, ur: 100 mg/dL — AB
Specific Gravity, Urine: 1.02 (ref 1.005–1.030)
pH: 7 (ref 5.0–8.0)

## 2021-03-01 LAB — CBC WITH DIFFERENTIAL/PLATELET
Abs Immature Granulocytes: 0.07 10*3/uL (ref 0.00–0.07)
Basophils Absolute: 0 10*3/uL (ref 0.0–0.1)
Basophils Relative: 0 %
Eosinophils Absolute: 0.1 10*3/uL (ref 0.0–0.5)
Eosinophils Relative: 1 %
HCT: 52.6 % — ABNORMAL HIGH (ref 36.0–46.0)
Hemoglobin: 17.2 g/dL — ABNORMAL HIGH (ref 12.0–15.0)
Immature Granulocytes: 1 %
Lymphocytes Relative: 14 %
Lymphs Abs: 1.4 10*3/uL (ref 0.7–4.0)
MCH: 29.9 pg (ref 26.0–34.0)
MCHC: 32.7 g/dL (ref 30.0–36.0)
MCV: 91.5 fL (ref 80.0–100.0)
Monocytes Absolute: 1 10*3/uL (ref 0.1–1.0)
Monocytes Relative: 10 %
Neutro Abs: 7.8 10*3/uL — ABNORMAL HIGH (ref 1.7–7.7)
Neutrophils Relative %: 74 %
Platelets: 314 10*3/uL (ref 150–400)
RBC: 5.75 MIL/uL — ABNORMAL HIGH (ref 3.87–5.11)
RDW: 13.4 % (ref 11.5–15.5)
WBC: 10.4 10*3/uL (ref 4.0–10.5)
nRBC: 0 % (ref 0.0–0.2)

## 2021-03-01 LAB — LIPASE, BLOOD: Lipase: 70 U/L — ABNORMAL HIGH (ref 11–51)

## 2021-03-01 MED ORDER — PROMETHAZINE HCL 25 MG RE SUPP: 25.0000 mg | Freq: Four times a day (QID) | RECTAL | 0 refills | Status: DC | PRN

## 2021-03-01 MED ORDER — ONDANSETRON HCL 4 MG/2ML IJ SOLN
4.0000 mg | Freq: Once | INTRAMUSCULAR | Status: AC
  Administered 2021-03-01: 4 mg via INTRAVENOUS
  Filled 2021-03-01: qty 2

## 2021-03-01 MED ORDER — SODIUM CHLORIDE 0.9 % IV SOLN
25.0000 mg | Freq: Once | INTRAVENOUS | Status: AC
  Administered 2021-03-01: 25 mg via INTRAVENOUS
  Filled 2021-03-01: qty 1

## 2021-03-01 MED ORDER — SODIUM CHLORIDE 0.9 % IV BOLUS
1000.0000 mL | Freq: Once | INTRAVENOUS | Status: AC
  Administered 2021-03-01: 1000 mL via INTRAVENOUS

## 2021-03-01 MED ORDER — PROMETHAZINE HCL 25 MG PO TABS: 25.0000 mg | ORAL_TABLET | Freq: Four times a day (QID) | ORAL | 0 refills | Status: DC | PRN

## 2021-03-01 NOTE — Discharge Instructions (Signed)
Please read and follow all provided instructions.  Your diagnoses today include:  1. Non-intractable vomiting with nausea, unspecified vomiting type     TTests performed today include:  Blood cell counts and platelets - white blood cell count improved  Kidney and liver function tests  Pancreas function test (called lipase) - slightly high but improving  Urine test to look for infection  A blood or urine test for pregnancy (women only)  Vital signs. See below for your results today.   Medications prescribed:   Phenergan (promethazine) - for nausea and vomiting  This was the same medication prescribed to you yesterday -- I sent in another rx in case you need it.   Take any prescribed medications only as directed.  Home care instructions:   Follow any educational materials contained in this packet.   You should rest for the next several days. Keep drinking plenty of fluids and use the medicine for nausea as directed.    Drink clear liquids for the next 24 hours and introduce solid foods slowly after 24 hours using the b.r.a.t. diet (Bananas, Rice, Applesauce, Toast, Yogurt).    Follow-up instructions: Please follow-up with your primary care provider in the next 3 days for further evaluation of your symptoms. If you are not feeling better in 48 hours you may have a condition that is more serious and you need re-evaluation.   Return instructions:  SEEK IMMEDIATE MEDICAL ATTENTION IF:  If you have pain that does not go away or becomes severe   A temperature above 101F develops   Repeated vomiting occurs (multiple episodes)   If you have pain that becomes localized to portions of the abdomen. The right side could possibly be appendicitis. In an adult, the left lower portion of the abdomen could be colitis or diverticulitis.   Blood is being passed in stools or vomit (bright red or black tarry stools)   You develop chest pain, difficulty breathing, dizziness or fainting,  or become confused, poorly responsive, or inconsolable (young children)  If you have any other emergent concerns regarding your health  Additional Information: Abdominal (belly) pain can be caused by many things. Your caregiver performed an examination and possibly ordered blood/urine tests and imaging (CT scan, x-rays, ultrasound). Many cases can be observed and treated at home after initial evaluation in the emergency department. Even though you are being discharged home, abdominal pain can be unpredictable. Therefore, you need a repeated exam if your pain does not resolve, returns, or worsens. Most patients with abdominal pain don't have to be admitted to the hospital or have surgery, but serious problems like appendicitis and gallbladder attacks can start out as nonspecific pain. Many abdominal conditions cannot be diagnosed in one visit, so follow-up evaluations are very important.  Your vital signs today were: BP (!) 166/94 (BP Location: Left Arm)   Pulse 62   Temp 99.4 F (37.4 C) (Oral)   Resp 18   Ht 5\' 4"  (1.626 m)   Wt 65.3 kg   SpO2 100%   BMI 24.72 kg/m  If your blood pressure (bp) was elevated above 135/85 this visit, please have this repeated by your doctor within one month. --------------

## 2021-03-01 NOTE — Progress Notes (Signed)
Patient ID: Kaitlyn York, female    DOB: 11-01-2001  MRN: [id number]  CC: HOSPITAL F/U  (Nausea/vomiting since Saturday)   Subjective: Kaitlyn York is a 20 y.o. female who presents for hospital follow-up. She is accompanied by her Mother.  1. HOSPITAL FOLLOW-UP: Visit 02/28/2021 at [facility] per MD note: Patient to ED with diffusion abdominal discomfort and vomiting for the past 2 days. Less vomiting today than yesterday, but not tolerating PO's.   She is overall well appearing. VSS, afebrile.   On review of labs, there is mild elevation of t-bili and indirect bili, and lipase. [place] ordered of RUQ despite no tenderness specific to RUQ.   [place] is normal. Nausea is improved and she is tolerating PO intake. She can be discharged home to already scheduled follow up with her PCP. Rx phenergan provided.   03/02/2021: Since hospital discharge has not taken Phenergan because unable to keep any food or medication down. Still feeling nauseated, certain smells can trigger, and usually nauseated upon awakening in the morning. Still having stomach pain.  Reports nausea and vomiting began April 2021 and has continued in episodes since then. Lost 50 pounds in about 1 month when initially began. Reports since 2 days ago lost 10 pounds. Reports vomited about 10 times 3 days ago, 5 times 1 day ago, and only once today.   Interested in getting possible IV infusions from a local business to help with dehydration from vomiting.    Patient Active Problem List   Diagnosis Date Noted  . Gonorrhea in female 01/10/2021  . Trichomonas vaginitis 01/10/2021  . Bacterial vaginitis 01/10/2021  . Influenza vaccine refused 11/02/2020  . History of herpes genitalis 11/02/2020  . Major depressive disorder, single episode, moderate (HCC) 11/02/2020  . Generalized anxiety disorder 11/02/2020  . Over weight 11/02/2020  . Current vaping on some days 11/02/2020  . Elevated blood-pressure reading  without diagnosis of hypertension 11/02/2020     Current Outpatient Medications on File Prior to Visit  Medication Sig Dispense Refill  . etonogestrel (NEXPLANON) 68 MG IMPL implant 1 each by Subdermal route continuous.    . potassium chloride SA (KLOR-CON) 20 MEQ tablet Take 1 tablet (20 mEq total) by mouth 2 (two) times daily for 5 days. 10 tablet 0   No current facility-administered medications on file prior to visit.    No Known Allergies  Social History   Socioeconomic History  . Marital status: Single    Spouse name: Not on file  . Number of children: 0  . Years of education: Not on file  . Highest education level: Some college, no degree  Occupational History  . Not on file  Tobacco Use  . Smoking status: Never Smoker  . Smokeless tobacco: Never Used  Vaping Use  . Vaping Use: Former  . Substances: Nicotine  Substance and Sexual Activity  . Alcohol use: Yes    Comment: 2 x a mth  . Drug use: Yes    Frequency: 7.0 times per week    Types: Marijuana  . Sexual activity: Yes    Birth control/protection: None  Other Topics Concern  . Not on file  Social History Narrative  . Not on file   Social Determinants of Health   Financial Resource Strain: Not on file  Food Insecurity: Not on file  Transportation Needs: Not on file  Physical Activity: Not on file  Stress: Not on file  Social Connections: Not on file  Intimate Partner  Violence: Not on file    Family History  Problem Relation Age of Onset  . Hypertension Maternal Aunt   . Hypertension Maternal Uncle   . Diabetes Maternal Grandmother   . Hypertension Maternal Grandmother   . Hypertension Mother     Past Surgical History:  Procedure Laterality Date  . NO PAST SURGERIES      ROS: Review of Systems Negative except as stated above  PHYSICAL EXAM: BP (!) 151/91 (BP Location: Left Arm, Patient Position: Sitting)   Pulse 77   Ht 5' 4.02" (1.626 m)   Wt 144 lb 9.6 oz (65.6 kg)   SpO2 98%   BMI  24.81 kg/m     Physical Exam Constitutional:      Appearance: She is normal weight.  HENT:     Head: Normocephalic and atraumatic.  Eyes:     Extraocular Movements: Extraocular movements intact.     Conjunctiva/sclera: Conjunctivae normal.     Pupils: Pupils are equal, round, and reactive to light.  Cardiovascular:     Rate and Rhythm: Normal rate and regular rhythm.     Pulses: Normal pulses.     Heart sounds: Normal heart sounds.  Pulmonary:     Effort: Pulmonary effort is normal.     Breath sounds: Normal breath sounds.  Abdominal:     General: Bowel sounds are normal.     Palpations: Abdomen is soft.     Tenderness: There is abdominal tenderness.  Musculoskeletal:     Cervical back: Normal range of motion and neck supple.  Neurological:     General: No focal deficit present.     Mental Status: She is alert and oriented to person, place, and time.  Psychiatric:        Mood and Affect: Mood normal.        Behavior: Behavior normal.    ASSESSMENT AND PLAN: 1. Hospital discharge follow-up: - Vomiting has decreased since hospital discharge. Nausea still present.  2. Nausea and vomiting, intractability of vomiting not specified, unspecified vomiting type: 3. Loss of weight: - Chronic nausea and vomiting intermittently since April 2021. Significant weight loss related to symptomology.  - Counseled that IV rehydration would be best in a hospital setting. Patient verbalized understanding.  - Abdominal ultrasound last obtained 02/28/2021. - Referral to Gastroenterology for further evaluation and management. - Follow-up with primary provider as scheduled.  - Ambulatory referral to Gastroenterology   Patient was given the opportunity to ask questions.  Patient verbalized understanding of the plan and was able to repeat key elements of the plan. Patient was given clear instructions to go to Emergency Department or return to medical center if symptoms don't improve, worsen, or  new problems develop.The patient verbalized understanding.   Orders Placed This Encounter  Procedures  . Ambulatory referral to Gastroenterology     Requested Prescriptions    No prescriptions requested or ordered in this encounter    Referral to Gastroenterology. Follow-up with primary provider as scheduled.   Rema Fendt, NP

## 2021-03-01 NOTE — ED Triage Notes (Signed)
Pt c/o N/V x4 days. Pt seen yesterday for same. Pt has been to UC twice in last few days. Pt states she cannot keep anything down. Pt states zofran does not work.

## 2021-03-01 NOTE — Patient Instructions (Addendum)
Annual physical exam and labs today.   Nausea and Vomiting, Adult Nausea is the feeling that you have an upset stomach or that you are about to vomit. Vomiting is when stomach contents are thrown up and out of the mouth as a result of nausea. Vomiting can make you feel weak and cause you to become dehydrated. Dehydration can make you feel tired and thirsty, cause you to have a dry mouth, and decrease how often you urinate. Older adults and people with other diseases or a weak disease-fighting system (immune system) are at higher risk for dehydration. It is important to treat your nausea and vomiting as told by your health care provider. Follow these instructions at home: Watch your symptoms for any changes. Tell your health care provider about them. Follow these instructions to care for yourself at home. Eating and drinking  Take an oral rehydration solution (ORS). This is a drink that is sold at pharmacies and retail stores.  Drink clear fluids slowly and in small amounts as you are able. Clear fluids include water, ice chips, low-calorie sports drinks, and fruit juice that has water added (diluted fruit juice).  Eat bland, easy-to-digest foods in small amounts as you are able. These foods include bananas, applesauce, rice, lean meats, toast, and crackers.  Avoid fluids that contain a lot of sugar or caffeine, such as energy drinks, sports drinks, and soda.  Avoid alcohol.  Avoid spicy or fatty foods.      General instructions  Take over-the-counter and prescription medicines only as told by your health care provider.  Drink enough fluid to keep your urine pale yellow.  Wash your hands often using soap and water. If soap and water are not available, use hand sanitizer.  Make sure that all people in your household wash their hands well and often.  Rest at home while you recover.  Watch your condition for any changes.  Breathe slowly and deeply when you feel nauseated.  Keep  all follow-up visits as told by your health care provider. This is important. Contact a health care provider if:  Your symptoms get worse.  You have new symptoms.  You have a fever.  You cannot drink fluids without vomiting.  Your nausea does not go away after 2 days.  You feel light-headed or dizzy.  You have a headache.  You have muscle cramps.  You have a rash.  You have pain while urinating. Get help right away if:  You have pain in your chest, neck, arm, or jaw.  You feel extremely weak or you faint.  You have persistent vomiting.  You have vomit that is bright red or looks like black coffee grounds.  You have bloody or black stools or stools that look like tar.  You have a severe headache, a stiff neck, or both.  You have severe pain, cramping, or bloating in your abdomen.  You have difficulty breathing, or you are breathing very quickly.  Your heart is beating very quickly.  Your skin feels cold and clammy.  You feel confused.  You have signs of dehydration, such as: ? Dark urine, very little urine, or no urine. ? Cracked lips. ? Dry mouth. ? Sunken eyes. ? Sleepiness. ? Weakness. These symptoms may represent a serious problem that is an emergency. Do not wait to see if the symptoms will go away. Get medical help right away. Call your local emergency services (911 in the [place] Do not drive yourself to the hospital. Summary  Nausea is the feeling that you have an upset stomach or that you are about to vomit. As nausea gets worse, it can lead to vomiting. Vomiting can make you feel weak and cause you to become dehydrated.  Follow instructions from your health care provider about eating and drinking to prevent dehydration.  Take over-the-counter and prescription medicines only as told by your health care provider.  Contact your health care provider if your symptoms get worse, or you have new symptoms.  Keep all follow-up visits as told by your  health care provider. This is important. This information is not intended to replace advice given to you by your health care provider. Make sure you discuss any questions you have with your health care provider. Document Revised: 03/14/2019 Document Reviewed: 04/30/2018 Elsevier Patient Education  2021 [employer].

## 2021-03-01 NOTE — ED Notes (Signed)
Pt to restroom

## 2021-03-01 NOTE — Telephone Encounter (Signed)
Pt called stating that she would like an early app Due to going to ED and Urgent Care. Was for N/V was given K+.  Pt has an app at 10:10am.

## 2021-03-01 NOTE — ED Provider Notes (Signed)
[facility]-EMERGENCY DEPT Provider Note   CSN: [id number] Arrival date & time: 03/01/21  1347     History Chief Complaint  Patient presents with  . Nausea  . Emesis    Kaitlyn York is a 20 y.o. female.  Patient with no past surgical history presents to the emergency department today for evaluation of nausea and vomiting.  Patient states that this has been ongoing for the past 3 days.  It coincided roughly with taking doxycycline.  Patient was seen in the emergency department yesterday where she was found to have slightly elevated lipase, bilirubin.  She had a right upper quadrant ultrasound which was negative.  She states that she followed up with her primary care provider today and was actually feeling better until after she left.  She states that she develops severe nausea around mealtimes and before bed.  She has tried Zofran ODT without improvement.  She has generalized crampy pain of her abdomen.  No diarrhea or constipation.  No urinary symptoms.  No fevers, chest pain, shortness of breath.  She denies heavy NSAID use.  Last alcohol intake was 1 week ago.  Denies THC use.         History reviewed. No pertinent past medical history.  Patient Active Problem List   Diagnosis Date Noted  . Gonorrhea in female 01/10/2021  . Trichomonas vaginitis 01/10/2021  . Bacterial vaginitis 01/10/2021  . Influenza vaccine refused 11/02/2020  . History of herpes genitalis 11/02/2020  . Major depressive disorder, single episode, moderate (HCC) 11/02/2020  . Generalized anxiety disorder 11/02/2020  . Over weight 11/02/2020  . Current vaping on some days 11/02/2020  . Elevated blood-pressure reading without diagnosis of hypertension 11/02/2020    Past Surgical History:  Procedure Laterality Date  . NO PAST SURGERIES       OB History   No obstetric history on file.     Family History  Problem Relation Age of Onset  . Hypertension Maternal Aunt   .  Hypertension Maternal Uncle   . Diabetes Maternal Grandmother   . Hypertension Maternal Grandmother   . Hypertension Mother     Social History   Tobacco Use  . Smoking status: Never Smoker  . Smokeless tobacco: Never Used  Vaping Use  . Vaping Use: Former  . Substances: Nicotine  Substance Use Topics  . Alcohol use: Yes    Comment: 2 x a mth  . Drug use: Yes    Frequency: 7.0 times per week    Types: Marijuana    Home Medications Prior to Admission medications   Medication Sig Start Date End Date Taking? Authorizing Provider  etonogestrel (NEXPLANON) 68 MG IMPL implant 1 each by Subdermal route once. Jan 2018    [provider]  potassium chloride SA (KLOR-CON) 20 MEQ tablet Take 1 tablet (20 mEq total) by mouth 2 (two) times daily for 5 days. Patient not taking: Reported on 03/01/2021 02/28/21 03/05/21  Elpidio Anis, PA-C  promethazine (PHENERGAN) 25 MG suppository Place 1 suppository (25 mg total) rectally every 6 (six) hours as needed for nausea or vomiting. 02/28/21   Elpidio Anis, PA-C  promethazine (PHENERGAN) 25 MG tablet Take 1 tablet (25 mg total) by mouth every 6 (six) hours as needed for nausea or vomiting. 02/28/21   Elpidio Anis, PA-C    Allergies    Patient has no known allergies.  Review of Systems   Review of Systems  Constitutional: Negative for fever.  HENT: Negative for rhinorrhea  and sore throat.   Eyes: Negative for redness.  Respiratory: Negative for cough.   Cardiovascular: Negative for chest pain.  Gastrointestinal: Positive for abdominal pain, nausea and vomiting. Negative for diarrhea.  Genitourinary: Negative for dysuria, frequency, hematuria and urgency.  Musculoskeletal: Negative for myalgias.  Skin: Negative for rash.  Neurological: Negative for headaches.    Physical Exam Updated Vital Signs BP (!) 165/108 (BP Location: Right Arm)   Pulse 100   Temp 99.4 F (37.4 C) (Oral)   Resp 15   SpO2 100%   Physical Exam Vitals  and nursing note reviewed.  Constitutional:      General: She is not in acute distress.    Appearance: She is well-developed.  HENT:     Head: Normocephalic and atraumatic.     Right Ear: External ear normal.     Left Ear: External ear normal.     Nose: Nose normal.  Eyes:     Conjunctiva/sclera: Conjunctivae normal.  Cardiovascular:     Rate and Rhythm: Normal rate and regular rhythm.     Heart sounds: No murmur heard.   Pulmonary:     Effort: No respiratory distress.     Breath sounds: No wheezing, rhonchi or rales.  Abdominal:     Palpations: Abdomen is soft.     Tenderness: There is abdominal tenderness. There is no guarding or rebound.     Comments: Mild generalized tenderness, no focal tenderness in the right upper quadrant or right lower quadrant.  Musculoskeletal:     Cervical back: Normal range of motion and neck supple.     Right lower leg: No edema.     Left lower leg: No edema.  Skin:    General: Skin is warm and dry.     Findings: No rash.  Neurological:     General: No focal deficit present.     Mental Status: She is alert. Mental status is at baseline.     Motor: No weakness.  Psychiatric:        Mood and Affect: Mood normal.     ED Results / Procedures / Treatments   Labs (all labs ordered are listed, but only abnormal results are displayed) Labs Reviewed  CBC WITH DIFFERENTIAL/PLATELET - Abnormal; Notable for the following components:      Result Value   RBC 5.75 (*)    Hemoglobin 17.2 (*)    HCT 52.6 (*)    Neutro Abs 7.8 (*)    All other components within normal limits  COMPREHENSIVE METABOLIC PANEL - Abnormal; Notable for the following components:   Potassium 3.4 (*)    Total Bilirubin 2.4 (*)    All other components within normal limits  LIPASE, BLOOD - Abnormal; Notable for the following components:   Lipase 70 (*)    All other components within normal limits  URINALYSIS, ROUTINE W REFLEX MICROSCOPIC - Abnormal; Notable for the following  components:   Ketones, ur 80 (*)    Protein, ur 100 (*)    Bacteria, UA RARE (*)    All other components within normal limits  I-STAT BETA HCG BLOOD, ED ([facility], [facility], [facility] ONLY)    EKG None  Radiology US Abdomen Limited  Result Date: 02/28/2021 CLINICAL DATA:  Right upper quadrant tenderness EXAM: ULTRASOUND ABDOMEN LIMITED RIGHT UPPER QUADRANT COMPARISON:  None. FINDINGS: Gallbladder: No gallstones or wall thickening visualized. No sonographic Murphy sign noted by sonographer. Common bile duct: Diameter: 5-6 mm Liver: No focal lesion identified. Within normal limits  in parenchymal echogenicity. Portal vein is patent on color Doppler imaging with normal direction of blood flow towards the liver. Other: None. IMPRESSION: Normal right upper quadrant sonogram Electronically Signed   By: Helyn Numbers MD   On: 02/28/2021 04:01    Procedures Procedures   Medications Ordered in ED Medications  ondansetron [facility]) injection 4 mg (4 mg Intravenous Given 03/01/21 1504)  sodium chloride 0.9 % bolus 1,000 mL (0 mLs Intravenous Stopped 03/01/21 1619)  promethazine (PHENERGAN) 25 mg in sodium chloride 0.9 % 50 mL IVPB (0 mg Intravenous Stopped 03/01/21 1901)  sodium chloride 0.9 % bolus 1,000 mL (1,000 mLs Intravenous New Bag/Given 03/01/21 1716)    ED Course  I have reviewed the triage vital signs and the nursing notes.  Pertinent labs & imaging results that were available during my care of the patient were reviewed by me and considered in my medical decision making (see chart for details).  Patient seen and examined. Work-up initiated. Medications ordered.   Vital signs reviewed and are as follows: BP (!) 165/108 (BP Location: Right Arm)   Pulse 100   Temp 99.4 F (37.4 C) (Oral)   Resp 15   SpO2 100%   3:40 PM WBC count, lipase improving today. Tbili slightly higher.    4:11 PM Pt updated on results. She states that she was able to get an appointment with GI on 03/04/21.   5:21 PM Pt had  recurrent nausea after trying to drink fluids. Phenergan, fluids ordered. Will reassess.   7:09 PM Pt feeling better.  Discussed discharge to home and she is in agreement.  She is tolerating fluids.  Encouraged GI follow-up as planned.  Rx phenergan for home.   The patient was urged to return to the Emergency Department immediately with worsening of current symptoms, worsening abdominal pain, persistent vomiting, blood noted in stools, fever, or any other concerns. The patient verbalized understanding.      MDM Rules/Calculators/A&P                          Patient with N/V, generalized abdominal pain. Vitals are stable, no fever. Labs overall improved since yesterday except for Tbili. Imaging [place] neg yesterday, CT not felt indicated today. Patient is tolerating PO's. Lungs are clear and no signs suggestive of PNA. Low concern for appendicitis, cholecystitis, pancreatitis, ruptured viscus, UTI, kidney stone, aortic dissection, aortic aneurysm or other emergent abdominal etiology. Supportive therapy indicated with return if symptoms worsen.    Final Clinical Impression(s) / ED Diagnoses Final diagnoses:  Non-intractable vomiting with nausea, unspecified vomiting type    Rx / DC Orders ED Discharge Orders         Ordered    promethazine (PHENERGAN) 25 MG suppository  Every 6 hours PRN        03/01/21 1907    promethazine (PHENERGAN) 25 MG tablet  Every 6 hours PRN        03/01/21 1907           Renne Crigler, Cordelia Poche 03/01/21 1911    Gwyneth Sprout, MD 03/02/21 2126

## 2021-03-01 NOTE — Progress Notes (Signed)
ED visit 02/28/21  Nausea/vomitting since Saturday Not able to keep any food down Stomach pain

## 2021-03-01 NOTE — ED Notes (Signed)
Pt given sprite and water

## 2021-03-02 ENCOUNTER — Encounter: Payer: 59 | Admitting: Family

## 2021-03-15 ENCOUNTER — Ambulatory Visit: Payer: 59 | Admitting: Nurse Practitioner

## 2021-03-17 ENCOUNTER — Encounter (HOSPITAL_COMMUNITY): Payer: Self-pay | Admitting: Emergency Medicine

## 2021-03-17 ENCOUNTER — Emergency Department (HOSPITAL_COMMUNITY)
Admission: EM | Admit: 2021-03-17 | Discharge: 2021-03-18 | Disposition: A | Discharge status: Home / Self Care | Payer: 59 | Attending: Emergency Medicine | Admitting: Emergency Medicine

## 2021-03-17 DIAGNOSIS — R1110 Vomiting, unspecified: Secondary | ICD-10-CM | POA: Diagnosis present

## 2021-03-17 DIAGNOSIS — F12188 Cannabis abuse with other cannabis-induced disorder: Secondary | ICD-10-CM | POA: Insufficient documentation

## 2021-03-17 DIAGNOSIS — R112 Nausea with vomiting, unspecified: Secondary | ICD-10-CM

## 2021-03-17 LAB — COMPREHENSIVE METABOLIC PANEL
ALT: 15 U/L (ref 0–44)
AST: 19 U/L (ref 15–41)
Albumin: 4.6 g/dL (ref 3.5–5.0)
Alkaline Phosphatase: 58 U/L (ref 38–126)
Anion gap: 11 (ref 5–15)
BUN: 7 mg/dL (ref 6–20)
CO2: 22 mmol/L (ref 22–32)
Calcium: 9.6 mg/dL (ref 8.9–10.3)
Chloride: 106 mmol/L (ref 98–111)
Creatinine, Ser: 0.7 mg/dL (ref 0.44–1.00)
GFR, Estimated: >60 mL/min (ref >60)
Glucose, Bld: 111 mg/dL — ABNORMAL HIGH (ref 70–99)
Potassium: 3.4 mmol/L — ABNORMAL LOW (ref 3.5–5.1)
Sodium: 139 mmol/L (ref 135–145)
Total Bilirubin: 1.6 mg/dL — ABNORMAL HIGH (ref 0.3–1.2)
Total Protein: 7.8 g/dL (ref 6.5–8.1)

## 2021-03-17 LAB — CBC
HCT: 46.1 % — ABNORMAL HIGH (ref 36.0–46.0)
Hemoglobin: 15.4 g/dL — ABNORMAL HIGH (ref 12.0–15.0)
MCH: 30.7 pg (ref 26.0–34.0)
MCHC: 33.4 g/dL (ref 30.0–36.0)
MCV: 91.8 fL (ref 80.0–100.0)
Platelets: 419 10*3/uL — ABNORMAL HIGH (ref 150–400)
RBC: 5.02 MIL/uL (ref 3.87–5.11)
RDW: 14.1 % (ref 11.5–15.5)
WBC: 9.8 10*3/uL (ref 4.0–10.5)
nRBC: 0 % (ref 0.0–0.2)

## 2021-03-17 LAB — LIPASE, BLOOD: Lipase: 26 U/L (ref 11–51)

## 2021-03-17 LAB — I-STAT BETA HCG BLOOD, ED (MC, WL, AP ONLY): I-stat hCG, quantitative: <5 m[IU]/mL (ref <5)

## 2021-03-17 MED ORDER — HALOPERIDOL LACTATE 5 MG/ML IJ SOLN
5.0000 mg | Freq: Once | INTRAMUSCULAR | Status: AC
  Administered 2021-03-18: 5 mg via INTRAVENOUS
  Filled 2021-03-17: qty 1

## 2021-03-17 MED ORDER — LACTATED RINGERS IV BOLUS
1000.0000 mL | Freq: Once | INTRAVENOUS | Status: AC
  Administered 2021-03-18: 1000 mL via INTRAVENOUS

## 2021-03-17 NOTE — ED Triage Notes (Signed)
Emergency Medicine Provider Triage Evaluation Note  Jocelyn Nold , a 20 y.o. female  was evaluated in triage.  Pt complains of nausea vomiting and generalized abdominal pain.  Symptoms began today but she had a similar episode 2 weeks ago, was referred to GI but does not have a follow-up appointment until later this month.  Reports that taking meds by mouth does not work because she cannot keep them down, she has nausea medicine at home but did not try it for her symptoms today.  Patient specifically states that she wants meds through the IV and does not want to try anything orally.  Reports generalized abdominal pain.  No fevers.  Review of Systems  Positive: Abdominal pain, nausea, vomiting Negative: Fever  Physical Exam  BP 125/76 (BP Location: Left Arm)   Pulse 91   Temp 98.7 F (37.1 C) (Oral)   Resp 18   SpO2 100%  Gen:   Awake, no distress   HEENT:  Atraumatic  Resp:  Normal effort  Cardiac:  Normal rate  Abd:   Nondistended, mild generalized tenderness without peritoneal signs MSK:   Moves extremities without difficulty  Neuro:  Speech clear   Medical Decision Making  Medically screening exam initiated at 10:28 PM.  Appropriate orders placed.  Patina Spanier was informed that the remainder of the evaluation will be completed by another provider, this initial triage assessment does not replace that evaluation, and the importance of remaining in the ED until their evaluation is complete.  Clinical Impression  1.  Generalized abdominal pain 2.  Vomiting    Dartha Lodge, [place] 03/17/21 2235

## 2021-03-17 NOTE — ED Triage Notes (Signed)
Pt reports that she started vomiting again today. She was seen about 2 weeks ago for same. Reports generalized abdominal pain and unable to tolerate PO.

## 2021-03-17 NOTE — ED Provider Notes (Signed)
[facility] EMERGENCY DEPARTMENT Provider Note  CSN: [id number] Arrival date & time: 03/17/21 2152    History Chief Complaint  Patient presents with  . Nausea    HPI  Kaitlyn York is a 20 y.o. female with no significant PMH has had intermittent episodes of vomiting and abdominal pain since Apr 2021. She has been seen in the ED several times for same including twice in March with ED workup being mostly negative. She has been referred to GI and is planning on EGD in a few weeks. She reports she was doing well after last ED visit until earlier today when she began having multiple episodes of vomiting and occasional diarrhea. She had her 20st birthday yesterday and was drinking heavily. She also smokes marijuana daily. Has been told in the past this may be contributing to her GI symptoms. She has had some mild diffuse abdominal pain today as well as some muscle spasms.    History reviewed. No pertinent past medical history.  Past Surgical History:  Procedure Laterality Date  . NO PAST SURGERIES      Family History  Problem Relation Age of Onset  . Hypertension Maternal Aunt   . Hypertension Maternal Uncle   . Diabetes Maternal Grandmother   . Hypertension Maternal Grandmother   . Hypertension Mother     Social History   Tobacco Use  . Smoking status: Never Smoker  . Smokeless tobacco: Never Used  Vaping Use  . Vaping Use: Former  . Substances: Nicotine  Substance Use Topics  . Alcohol use: Yes    Comment: 2 x a mth  . Drug use: Yes    Frequency: 7.0 times per week    Types: Marijuana     Home Medications Prior to Admission medications   Medication Sig Start Date End Date Taking? Authorizing Provider  amitriptyline (ELAVIL) 25 MG tablet Take 25 mg by mouth at bedtime. 03/04/21  Yes [provider]  etonogestrel (NEXPLANON) 68 MG IMPL implant 1 each by Subdermal route continuous.   Yes [provider]  ondansetron (ZOFRAN ODT) 4 MG disintegrating  tablet Take 1 tablet (4 mg total) by mouth every 8 (eight) hours as needed for nausea or vomiting. 03/18/21  Yes Pollyann Savoy, MD  potassium chloride SA (KLOR-CON) 20 MEQ tablet Take 1 tablet (20 mEq total) by mouth 2 (two) times daily for 5 days. 02/28/21 03/05/21  Elpidio Anis, PA-C  promethazine (PHENERGAN) 25 MG suppository Place 1 suppository (25 mg total) rectally every 6 (six) hours as needed for nausea or vomiting. 03/01/21   Renne Crigler, PA-C  promethazine (PHENERGAN) 25 MG tablet Take 1 tablet (25 mg total) by mouth every 6 (six) hours as needed for nausea or vomiting. 03/01/21   Renne Crigler, PA-C     Allergies    Patient has no known allergies.   Review of Systems   Review of Systems A comprehensive review of systems was completed and negative except as noted in HPI.    Physical Exam BP (!) 124/55   Pulse 70   Temp 98.4 F (36.9 C) (Oral)   Resp 18   SpO2 96%   Physical Exam Vitals and nursing note reviewed.  Constitutional:      Appearance: Normal appearance.  HENT:     Head: Normocephalic and atraumatic.     Nose: Nose normal.     Mouth/Throat:     Mouth: Mucous membranes are dry.  Eyes:     Extraocular Movements: Extraocular movements  intact.     Conjunctiva/sclera: Conjunctivae normal.  Cardiovascular:     Rate and Rhythm: Normal rate.  Pulmonary:     Effort: Pulmonary effort is normal.     Breath sounds: Normal breath sounds.  Abdominal:     General: Abdomen is flat. There is no distension.     Palpations: Abdomen is soft.     Tenderness: There is no abdominal tenderness. There is no guarding.  Musculoskeletal:        General: No swelling. Normal range of motion.     Cervical back: Neck supple.  Skin:    General: Skin is warm and dry.  Neurological:     General: No focal deficit present.     Mental Status: She is alert.  Psychiatric:        Mood and Affect: Mood normal.      ED Results / Procedures / Treatments   Labs (all labs  ordered are listed, but only abnormal results are displayed) Labs Reviewed  COMPREHENSIVE METABOLIC PANEL - Abnormal; Notable for the following components:      Result Value   Potassium 3.4 (*)    Glucose, Bld 111 (*)    Total Bilirubin 1.6 (*)    All other components within normal limits  CBC - Abnormal; Notable for the following components:   Hemoglobin 15.4 (*)    HCT 46.1 (*)    Platelets 419 (*)    All other components within normal limits  URINALYSIS, ROUTINE W REFLEX MICROSCOPIC - Abnormal; Notable for the following components:   pH 9.0 (*)    Ketones, ur 80 (*)    Protein, ur 100 (*)    All other components within normal limits  LIPASE, BLOOD  RAPID URINE DRUG SCREEN, HOSP PERFORMED  I-STAT BETA HCG BLOOD, ED ([facility], [facility], [facility] ONLY)    EKG None  Radiology No results found.  Procedures Procedures  Medications Ordered in the ED Medications  haloperidol lactate (HALDOL) injection 5 mg (5 mg Intravenous Given 03/18/21 0125)  lactated ringers bolus 1,000 mL (0 mLs Intravenous Stopped 03/18/21 0222)     MDM Rules/Calculators/A&P MDM Labs ordered in triage to evaluation elyte abnormalities. She has been hypokalemic in the past but has not taken prescribed K supplements at home. Haldol and IVF for symptom care. She was advised to stop smoking marijuana.   ED Course  I have reviewed the triage vital signs and the nursing notes.  Pertinent labs & imaging results that were available during my care of the patient were reviewed by me and considered in my medical decision making (see chart for details).  Clinical Course as of 03/18/21 0319  Thu Mar 17, 2021  2321 CBC is unremarkable.  [CS]  Fri Mar 18, 2021  0019 CMP with mild hypokalemia, similar to previous. Lipase is normal.  [CS]  0304 UA with ketones, consistent with dehydration. She reports she is feeling better after IVF and haldol. Will re-check after PO trial.  [CS]  4098 Patient has tolerated PO fluids and would like  to go home. Reiterated she should stop marijuana use. Follow up with PCP and GI as an outpatient.  [CS]    Clinical Course User Index [CS] Pollyann Savoy, MD    Final Clinical Impression(s) / ED Diagnoses Final diagnoses:  Cannabinoid hyperemesis syndrome    Rx / DC Orders ED Discharge Orders         Ordered    ondansetron (ZOFRAN ODT) 4 MG disintegrating tablet  Every 8 hours PRN        03/18/21 0319           Pollyann Savoy, MD 03/18/21 [phone]

## 2021-03-18 LAB — URINALYSIS, ROUTINE W REFLEX MICROSCOPIC
Bacteria, UA: NONE SEEN
Bilirubin Urine: NEGATIVE
Glucose, UA: NEGATIVE mg/dL
Hgb urine dipstick: NEGATIVE
Ketones, ur: 80 mg/dL — AB
Leukocytes,Ua: NEGATIVE
Nitrite: NEGATIVE
Protein, ur: 100 mg/dL — AB
Specific Gravity, Urine: 1.027 (ref 1.005–1.030)
pH: 9 — ABNORMAL HIGH (ref 5.0–8.0)

## 2021-03-18 LAB — RAPID URINE DRUG SCREEN, HOSP PERFORMED
Amphetamines: NOT DETECTED
Barbiturates: NOT DETECTED
Benzodiazepines: NOT DETECTED
Cocaine: NOT DETECTED
Opiates: NOT DETECTED
Tetrahydrocannabinol: POSITIVE — AB

## 2021-03-18 MED ORDER — ONDANSETRON 4 MG PO TBDP: 4.0000 mg | ORAL_TABLET | Freq: Three times a day (TID) | ORAL | 0 refills | Status: DC | PRN

## 2021-03-18 NOTE — ED Notes (Signed)
Patient states she is feeling much better after fluids/Haldol Patient ambulatory to bathroom to provide urine sample Patient provided popsicle at request of physician for fluid challenge Patient instructed to let this RN know if any symptoms of nausea/vomiting occur

## 2021-03-29 NOTE — Progress Notes (Signed)
Patient did not show for appointment.   

## 2021-03-30 ENCOUNTER — Encounter: Payer: 59 | Admitting: Family

## 2021-03-30 DIAGNOSIS — Z1329 Encounter for screening for other suspected endocrine disorder: Secondary | ICD-10-CM

## 2021-03-30 DIAGNOSIS — Z13228 Encounter for screening for other metabolic disorders: Secondary | ICD-10-CM

## 2021-03-30 DIAGNOSIS — Z131 Encounter for screening for diabetes mellitus: Secondary | ICD-10-CM

## 2021-03-30 DIAGNOSIS — Z0000 Encounter for general adult medical examination without abnormal findings: Secondary | ICD-10-CM

## 2021-03-30 DIAGNOSIS — Z13220 Encounter for screening for lipoid disorders: Secondary | ICD-10-CM

## 2021-03-30 DIAGNOSIS — Z130 Encounter for screening for diseases of the blood and blood-forming organs and certain disorders involving the immune mechanism: Secondary | ICD-10-CM

## 2021-04-25 NOTE — Progress Notes (Signed)
Patient ID: Kaitlyn York, female    DOB: Apr 30, 2001  MRN: [id number]   CC: Annual Physical Exam  Subjective: Kaitlyn York is a 20 y.o. female who presents for annual physical exam.  Her concerns today include: none.   Patient Active Problem List   Diagnosis Date Noted  . Gonorrhea in female 01/10/2021  . Trichomonas vaginitis 01/10/2021  . Bacterial vaginitis 01/10/2021  . Influenza vaccine refused 11/02/2020  . History of herpes genitalis 11/02/2020  . Major depressive disorder, single episode, moderate (HCC) 11/02/2020  . Generalized anxiety disorder 11/02/2020  . Over weight 11/02/2020  . Current vaping on some days 11/02/2020  . Elevated blood-pressure reading without diagnosis of hypertension 11/02/2020     Current Outpatient Medications on File Prior to Visit  Medication Sig Dispense Refill  . etonogestrel (NEXPLANON) 68 MG IMPL implant 1 each by Subdermal route continuous.    Marland Kitchen amitriptyline (ELAVIL) 25 MG tablet Take 25 mg by mouth at bedtime.    . ondansetron (ZOFRAN ODT) 4 MG disintegrating tablet Take 1 tablet (4 mg total) by mouth every 8 (eight) hours as needed for nausea or vomiting. 20 tablet 0  . potassium chloride SA (KLOR-CON) 20 MEQ tablet Take 1 tablet (20 mEq total) by mouth 2 (two) times daily for 5 days. 10 tablet 0  . promethazine (PHENERGAN) 25 MG suppository Place 1 suppository (25 mg total) rectally every 6 (six) hours as needed for nausea or vomiting. 12 each 0  . promethazine (PHENERGAN) 25 MG tablet Take 1 tablet (25 mg total) by mouth every 6 (six) hours as needed for nausea or vomiting. 12 tablet 0   No current facility-administered medications on file prior to visit.    No Known Allergies  Social History   Socioeconomic History  . Marital status: Single    Spouse name: Not on file  . Number of children: 0  . Years of education: Not on file  . Highest education level: Some college, no degree  Occupational History  . Not on file   Tobacco Use  . Smoking status: Never Smoker  . Smokeless tobacco: Never Used  Vaping Use  . Vaping Use: Former  . Substances: Nicotine  Substance and Sexual Activity  . Alcohol use: Yes    Comment: 2 x a mth  . Drug use: Yes    Frequency: 7.0 times per week    Types: Marijuana  . Sexual activity: Yes    Birth control/protection: None  Other Topics Concern  . Not on file  Social History Narrative  . Not on file   Social Determinants of Health   Financial Resource Strain: Not on file  Food Insecurity: Not on file  Transportation Needs: Not on file  Physical Activity: Not on file  Stress: Not on file  Social Connections: Not on file  Intimate Partner Violence: Not on file    Family History  Problem Relation Age of Onset  . Hypertension Maternal Aunt   . Hypertension Maternal Uncle   . Diabetes Maternal Grandmother   . Hypertension Maternal Grandmother   . Hypertension Mother     Past Surgical History:  Procedure Laterality Date  . NO PAST SURGERIES      ROS: Review of Systems Negative except as stated above  PHYSICAL EXAM: BP 125/78 (BP Location: Left Arm, Patient Position: Sitting, Cuff Size: Normal)   Pulse 70   Temp 98.4 F (36.9 C)   Resp 15   Ht 5' 4.02" (1.626 m)  Wt 144 lb 3.2 oz (65.4 kg)   SpO2 98%   BMI 24.74 kg/m   Wt Readings from Last 3 Encounters:  04/26/21 144 lb 3.2 oz (65.4 kg)  03/01/21 144 lb (65.3 kg)  03/01/21 144 lb 9.6 oz (65.6 kg)   Physical Exam HENT:     Head: Normocephalic and atraumatic.     Right Ear: Tympanic membrane, ear canal and external ear normal.     Left Ear: Tympanic membrane, ear canal and external ear normal.     Nose: Nose normal.     Mouth/Throat:     Mouth: Mucous membranes are moist.     Pharynx: Oropharynx is clear.  Eyes:     Extraocular Movements: Extraocular movements intact.     Conjunctiva/sclera: Conjunctivae normal.     Pupils: Pupils are equal, round, and reactive to light.   Cardiovascular:     Rate and Rhythm: Normal rate and regular rhythm.     Pulses: Normal pulses.     Heart sounds: Normal heart sounds.  Pulmonary:     Effort: Pulmonary effort is normal.     Breath sounds: Normal breath sounds.  Chest:     Comments: Patient declined examination. Abdominal:     General: Bowel sounds are normal.     Palpations: Abdomen is soft.  Genitourinary:    Comments: Patient declined examination.  Musculoskeletal:        General: Normal range of motion.     Cervical back: Normal range of motion and neck supple.  Skin:    General: Skin is warm and dry.     Capillary Refill: Capillary refill takes less than 2 seconds.  Neurological:     General: No focal deficit present.     Mental Status: She is alert and oriented to person, place, and time.  Psychiatric:        Mood and Affect: Mood normal.        Behavior: Behavior normal.    ASSESSMENT AND PLAN: 1. Annual physical exam: - Counseled on 150 minutes of exercise per week as tolerated, healthy eating (including decreased daily intake of saturated fats, cholesterol, added sugars, sodium), STI prevention, and routine healthcare maintenance.  2. Screening for metabolic disorder: - CMP to check kidney function, liver function, and electrolyte balance.  - Comprehensive metabolic panel  3. Screening for deficiency anemia: - CBC to screen for anemia. - CBC  4. Diabetes mellitus screening: - Hemoglobin A1c to screen for pre-diabetes/diabetes. - Hemoglobin A1c  5. Screening cholesterol level: - Lipid panel to screen for high cholesterol.  - Lipid panel  6. Thyroid disorder screen: - TSH to check thyroid function.  - TSH  7. Pap smear for cervical cancer screening: - Referral to Gynecology for cervical cancer screening by PAP smear.  - Ambulatory referral to Gynecology  8. Need for HPV vaccination: - Administered today in office.  - HPV 9-valent vaccine,Recombinat  9. Need for Tdap vaccination:   - Administered today in office.  - Tdap vaccine greater than or equal to 7yo IM   Patient was given the opportunity to ask questions.  Patient verbalized understanding of the plan and was able to repeat key elements of the plan. Patient was given clear instructions to go to Emergency Department or return to medical center if symptoms don't improve, worsen, or new problems develop.The patient verbalized understanding.   Orders Placed This Encounter  Procedures  . Tdap vaccine greater than or equal to 7yo IM  . HPV  9-valent vaccine,Recombinat  . Lipid panel  . TSH  . Hemoglobin A1c  . CBC  . Comprehensive metabolic panel  . Ambulatory referral to Gynecology    Follow-up with primary provider as scheduled.   Rema Fendt, NP

## 2021-04-26 ENCOUNTER — Other Ambulatory Visit: Payer: Self-pay

## 2021-04-26 ENCOUNTER — Ambulatory Visit (INDEPENDENT_AMBULATORY_CARE_PROVIDER_SITE_OTHER): Payer: 59 | Admitting: Family

## 2021-04-26 ENCOUNTER — Encounter: Payer: Self-pay | Admitting: Family

## 2021-04-26 VITALS — BP 125/78 | HR 70 | Temp 98.4°F | Resp 15 | Ht 64.02 in | Wt 144.2 lb

## 2021-04-26 DIAGNOSIS — Z0000 Encounter for general adult medical examination without abnormal findings: Secondary | ICD-10-CM

## 2021-04-26 DIAGNOSIS — Z13220 Encounter for screening for lipoid disorders: Secondary | ICD-10-CM

## 2021-04-26 DIAGNOSIS — Z124 Encounter for screening for malignant neoplasm of cervix: Secondary | ICD-10-CM

## 2021-04-26 DIAGNOSIS — Z1329 Encounter for screening for other suspected endocrine disorder: Secondary | ICD-10-CM

## 2021-04-26 DIAGNOSIS — Z131 Encounter for screening for diabetes mellitus: Secondary | ICD-10-CM

## 2021-04-26 DIAGNOSIS — Z23 Encounter for immunization: Secondary | ICD-10-CM

## 2021-04-26 DIAGNOSIS — Z13228 Encounter for screening for other metabolic disorders: Secondary | ICD-10-CM

## 2021-04-26 DIAGNOSIS — Z130 Encounter for screening for diseases of the blood and blood-forming organs and certain disorders involving the immune mechanism: Secondary | ICD-10-CM

## 2021-04-26 NOTE — Patient Instructions (Signed)
Preventive Care 21-21 Years Old, Female Preventive care refers to lifestyle choices and visits with your health care provider that can promote health and wellness. This includes:  A yearly physical exam. This is also called an annual wellness visit.  Regular dental and eye exams.  Immunizations.  Screening for certain conditions.  Healthy lifestyle choices, such as: ? Eating a healthy diet. ? Getting regular exercise. ? Not using drugs or products that contain nicotine and tobacco. ? Limiting alcohol use. What can I expect for my preventive care visit? Physical exam Your health care provider may check your:  Height and weight. These may be used to calculate your BMI (body mass index). BMI is a measurement that tells if you are at a healthy weight.  Heart rate and blood pressure.  Body temperature.  Skin for abnormal spots. Counseling Your health care provider may ask you questions about your:  Past medical problems.  Family's medical history.  Alcohol, tobacco, and drug use.  Emotional well-being.  Home life and relationship well-being.  Sexual activity.  Diet, exercise, and sleep habits.  Work and work environment.  Access to firearms.  Method of birth control.  Menstrual cycle.  Pregnancy history. What immunizations do I need? Vaccines are usually given at various ages, according to a schedule. Your health care provider will recommend vaccines for you based on your age, medical history, and lifestyle or other factors, such as travel or where you work.   What tests do I need? Blood tests  Lipid and cholesterol levels. These may be checked every 5 years starting at age 20.  Hepatitis C test.  Hepatitis B test. Screening  Diabetes screening. This is done by checking your blood sugar (glucose) after you have not eaten for a while (fasting).  STD (sexually transmitted disease) testing, if you are at risk.  BRCA-related cancer screening. This may be  done if you have a family history of breast, ovarian, tubal, or peritoneal cancers.  Pelvic exam and Pap test. This may be done every 3 years starting at age 21. Starting at age 30, this may be done every 5 years if you have a Pap test in combination with an HPV test. Talk with your health care provider about your test results, treatment options, and if necessary, the need for more tests.   Follow these instructions at home: Eating and drinking  Eat a healthy diet that includes fresh fruits and vegetables, whole grains, lean protein, and low-fat dairy products.  Take vitamin and mineral supplements as recommended by your health care provider.  Do not drink alcohol if: ? Your health care provider tells you not to drink. ? You are pregnant, may be pregnant, or are planning to become pregnant.  If you drink alcohol: ? Limit how much you have to 0-1 drink a day. ? Be aware of how much alcohol is in your drink. In the [place] one drink equals one 12 oz bottle of beer (355 mL), one 5 oz glass of wine (148 mL), or one 1 oz glass of hard liquor (44 mL).   Lifestyle  Take daily care of your teeth and gums. Brush your teeth every morning and night with fluoride toothpaste. Floss one time each day.  Stay active. Exercise for at least 30 minutes 5 or more days each week.  Do not use any products that contain nicotine or tobacco, such as cigarettes, e-cigarettes, and chewing tobacco. If you need help quitting, ask your health care provider.  Do not   use drugs.  If you are sexually active, practice safe sex. Use a condom or other form of protection to prevent STIs (sexually transmitted infections).  If you do not wish to become pregnant, use a form of birth control. If you plan to become pregnant, see your health care provider for a prepregnancy visit.  Find healthy ways to cope with stress, such as: ? Meditation, yoga, or listening to music. ? Journaling. ? Talking to a trusted  person. ? Spending time with friends and family. Safety  Always wear your seat belt while driving or riding in a vehicle.  Do not drive: ? If you have been drinking alcohol. Do not ride with someone who has been drinking. ? When you are tired or distracted. ? While texting.  Wear a helmet and other protective equipment during sports activities.  If you have firearms in your house, make sure you follow all gun safety procedures.  Seek help if you have been physically or sexually abused. What's next?  Go to your health care provider once a year for an annual wellness visit.  Ask your health care provider how often you should have your eyes and teeth checked.  Stay up to date on all vaccines. This information is not intended to replace advice given to you by your health care provider. Make sure you discuss any questions you have with your health care provider. Document Revised: 07/18/2020 Document Reviewed: 08/01/2018 Elsevier Patient Education  2021 Elsevier Inc.  

## 2021-04-26 NOTE — Progress Notes (Signed)
Annual physical exam Tetanus and 1st dose of HPV will be administered per pt request

## 2021-04-27 LAB — LIPID PANEL
Chol/HDL Ratio: 2.6 ratio (ref 0.0–4.4)
Cholesterol, Total: 151 mg/dL (ref 100–199)
HDL: 57 mg/dL (ref >39)
LDL Chol Calc (NIH): 78 mg/dL (ref 0–99)
Triglycerides: 84 mg/dL (ref 0–149)
VLDL Cholesterol Cal: 16 mg/dL (ref 5–40)

## 2021-04-27 LAB — CBC
Hematocrit: 45.4 % (ref 34.0–46.6)
Hemoglobin: 15 g/dL (ref 11.1–15.9)
MCH: 30.2 pg (ref 26.6–33.0)
MCHC: 33 g/dL (ref 31.5–35.7)
MCV: 92 fL (ref 79–97)
Platelets: 336 10*3/uL (ref 150–450)
RBC: 4.96 x10E6/uL (ref 3.77–5.28)
RDW: 11.8 % (ref 11.7–15.4)
WBC: 5.8 10*3/uL (ref 3.4–10.8)

## 2021-04-27 LAB — TSH: TSH: 1 u[IU]/mL (ref 0.450–4.500)

## 2021-04-27 LAB — COMPREHENSIVE METABOLIC PANEL
ALT: 8 IU/L (ref 0–32)
AST: 10 IU/L (ref 0–40)
Albumin/Globulin Ratio: 2.1 (ref 1.2–2.2)
Albumin: 4.4 g/dL (ref 3.9–5.0)
Alkaline Phosphatase: 65 IU/L (ref 44–121)
BUN/Creatinine Ratio: 6 — ABNORMAL LOW (ref 9–23)
BUN: 5 mg/dL — ABNORMAL LOW (ref 6–20)
Bilirubin Total: 1.2 mg/dL (ref 0.0–1.2)
CO2: 22 mmol/L (ref 20–29)
Calcium: 9 mg/dL (ref 8.7–10.2)
Chloride: 102 mmol/L (ref 96–106)
Creatinine, Ser: 0.77 mg/dL (ref 0.57–1.00)
Globulin, Total: 2.1 g/dL (ref 1.5–4.5)
Glucose: 84 mg/dL (ref 65–99)
Potassium: 4.2 mmol/L (ref 3.5–5.2)
Sodium: 139 mmol/L (ref 134–144)
Total Protein: 6.5 g/dL (ref 6.0–8.5)
eGFR: 112 mL/min/{1.73_m2} (ref >59)

## 2021-04-27 LAB — HEMOGLOBIN A1C
Est. average glucose Bld gHb Est-mCnc: 88 mg/dL
Hgb A1c MFr Bld: 4.7 % — ABNORMAL LOW (ref 4.8–5.6)

## 2021-04-27 NOTE — Progress Notes (Signed)
Kidney function normal.   Liver function normal.   Thyroid function normal.   Cholesterol normal.  No diabetes.   No anemia. 

## 2021-07-07 ENCOUNTER — Encounter: Payer: Self-pay | Admitting: Obstetrics and Gynecology

## 2021-07-07 ENCOUNTER — Other Ambulatory Visit (HOSPITAL_COMMUNITY)
Admission: RE | Admit: 2021-07-07 | Discharge: 2021-07-07 | Disposition: A | Discharge status: Home / Self Care | Payer: 59 | Source: Ambulatory Visit | Attending: Obstetrics and Gynecology | Admitting: Obstetrics and Gynecology

## 2021-07-07 ENCOUNTER — Ambulatory Visit (INDEPENDENT_AMBULATORY_CARE_PROVIDER_SITE_OTHER): Payer: 59 | Admitting: Obstetrics and Gynecology

## 2021-07-07 ENCOUNTER — Other Ambulatory Visit: Payer: Self-pay

## 2021-07-07 VITALS — BP 129/69 | HR 73 | Temp 98.5°F | Ht 64.0 in | Wt 150.8 lb

## 2021-07-07 DIAGNOSIS — B9689 Other specified bacterial agents as the cause of diseases classified elsewhere: Secondary | ICD-10-CM | POA: Insufficient documentation

## 2021-07-07 DIAGNOSIS — Z113 Encounter for screening for infections with a predominantly sexual mode of transmission: Secondary | ICD-10-CM

## 2021-07-07 DIAGNOSIS — A5402 Gonococcal vulvovaginitis, unspecified: Secondary | ICD-10-CM | POA: Diagnosis not present

## 2021-07-07 DIAGNOSIS — N898 Other specified noninflammatory disorders of vagina: Secondary | ICD-10-CM | POA: Diagnosis not present

## 2021-07-07 DIAGNOSIS — N760 Acute vaginitis: Secondary | ICD-10-CM | POA: Insufficient documentation

## 2021-07-07 DIAGNOSIS — Z01419 Encounter for gynecological examination (general) (routine) without abnormal findings: Secondary | ICD-10-CM

## 2021-07-07 DIAGNOSIS — Z3040 Encounter for surveillance of contraceptives, unspecified: Secondary | ICD-10-CM | POA: Diagnosis not present

## 2021-07-08 LAB — RPR+HBSAG+HCVAB+...
HIV Screen 4th Generation wRfx: NONREACTIVE
Hep C Virus Ab: <0.1 s/co ratio (ref 0.0–0.9)
Hepatitis B Surface Ag: NEGATIVE
RPR Ser Ql: NONREACTIVE

## 2021-07-10 LAB — CERVICOVAGINAL ANCILLARY ONLY
Bacterial Vaginitis (gardnerella): POSITIVE — AB
Candida Glabrata: NEGATIVE
Candida Vaginitis: NEGATIVE
Chlamydia: NEGATIVE
Comment: NEGATIVE
Comment: NEGATIVE
Comment: NEGATIVE
Comment: NEGATIVE
Comment: NEGATIVE
Comment: NORMAL
Neisseria Gonorrhea: POSITIVE — AB
Trichomonas: NEGATIVE

## 2021-07-10 NOTE — Progress Notes (Addendum)
  GYNECOLOGY PROGRESS NOTE  History:  Ms. Kaitlyn York is a 20 y.o. G0. presents to [facility] office today for pap only visit. She was referred by her PCP. She ia\s also requesting STI testing today. She has a Nexplanon that was inserted 12/2016. She complains of spotting off & on in between periods. She denies h/a, dizziness, shortness of breath, n/v, or fever/chills.    The following portions of the patient's history were reviewed and updated as appropriate: allergies, current medications, past family history, past medical history, past social history, past surgical history and problem list. Last pap smear on never had d/t age.  Review of Systems:  Pertinent items are noted in HPI.   Objective:  Physical Exam Blood pressure 129/69, pulse 73, temperature 98.5 F (36.9 C), temperature source Oral, height 5\' 4"  (1.626 m), weight 150 lb 12.8 oz (68.4 kg). VS reviewed, nursing note reviewed,  Constitutional: well developed, well nourished, no distress HEENT: normocephalic CV: normal rate Pulm/chest wall: normal effort Breast Exam: deferred Abdomen: soft Neuro: alert and oriented x 3 Skin: warm, dry Psych: affect normal Pelvic exam: Cervix pink, visually closed, without lesion, scant white creamy discharge, vaginal walls and external genitalia normal Bimanual exam: Cervix 0/long/high, firm, anterior, neg CMT, uterus nontender, nonenlarged, adnexa without tenderness, enlargement, or mass Thin prep pap with reflex HPV and STI testing done  Assessment & Plan:  1. Vaginal discharge - Cervicovaginal ancillary only( [place])  2. Encounter for gynecological examination with Papanicolaou smear of cervix - Cytology - PAP( [place])  3. Screen for STD (sexually transmitted disease) - Cervicovaginal ancillary only( [place]) - RPR+HBsAg+HCVAb+...  4. Encounter for surveillance of contraceptive device - Advised that Nexplanon is expired and is more than likely the cause of  her spotting in between periods - Schedule for Nexplanon removal and re-insertion  , CNM 2:45 PM

## 2021-07-11 LAB — CYTOLOGY - PAP: Diagnosis: NEGATIVE

## 2021-07-12 ENCOUNTER — Telehealth: Payer: Self-pay | Admitting: *Deleted

## 2021-07-12 DIAGNOSIS — A5901 Trichomonal vulvovaginitis: Secondary | ICD-10-CM

## 2021-07-12 DIAGNOSIS — N760 Acute vaginitis: Secondary | ICD-10-CM

## 2021-07-12 DIAGNOSIS — B9689 Other specified bacterial agents as the cause of diseases classified elsewhere: Secondary | ICD-10-CM

## 2021-07-12 MED ORDER — METRONIDAZOLE 500 MG PO TABS
500.0000 mg | ORAL_TABLET | Freq: Two times a day (BID) | ORAL | 0 refills | Status: AC
Start: 2021-07-12 — End: 2021-07-19

## 2021-07-12 NOTE — Telephone Encounter (Signed)
Left voice message for patient to call clinic regarding test results. Will send a mychart message.  Clovis Pu, RN  Patient returned called, verified DOB. Patient vaginal culture positive for BV and gonorrhea. Rx for Metronidazole 500 mg 1 tablet PO x 7 days with food. Appt 07/13/21 at 1 PM for GC tx.  Clovis Pu, RN

## 2021-07-12 NOTE — Telephone Encounter (Signed)
-----   Message from Raelyn Mora, [place] sent at 07/11/2021  6:08 PM EDT ----- Please treat for gonorrhea and BV

## 2021-07-13 ENCOUNTER — Ambulatory Visit (INDEPENDENT_AMBULATORY_CARE_PROVIDER_SITE_OTHER): Payer: 59 | Admitting: *Deleted

## 2021-07-13 ENCOUNTER — Other Ambulatory Visit: Payer: Self-pay

## 2021-07-13 VITALS — BP 136/81 | HR 76 | Temp 97.9°F | Ht 64.0 in | Wt 149.8 lb

## 2021-07-13 DIAGNOSIS — A549 Gonococcal infection, unspecified: Secondary | ICD-10-CM

## 2021-07-13 MED ORDER — CEFTRIAXONE SODIUM 500 MG IJ SOLR
500.0000 mg | Freq: Once | INTRAMUSCULAR | Status: AC
  Administered 2021-07-13: 500 mg via INTRAMUSCULAR

## 2021-07-13 NOTE — Progress Notes (Signed)
      S: Patient in nurse clinic today for STD treatment of gonorrhea.   O: Need for treatment of gonorrhea.  A: Ceftriaxone 500 mg IM x 1 given in [facility]OQ per standing procotol. 's orders.    Patient observed 15 minutes in office.  No reaction noted.   P: Patient to follow up in 1 month for re-screening.    STD report form fax completed and faxed to [facility] Department at [phone] (STD department).     Patient advised to abstain from sex for 7-10 days after treatment or when partner has been tested/treated.    Clovis Pu, RN

## 2021-07-27 ENCOUNTER — Ambulatory Visit: Payer: 59 | Admitting: Certified Nurse Midwife

## 2021-07-31 IMAGING — US US ABDOMEN LIMITED RUQ/ASCITES
1 series · 14 of 25 positions shown · non-contrast
Comparison: None.

CLINICAL DATA: Right upper quadrant tenderness

EXAM:
ULTRASOUND ABDOMEN LIMITED RIGHT UPPER QUADRANT

[Series 1: us abdomen limited ruq/ascites · 14 of 36 slices shown]
[im 1/36]
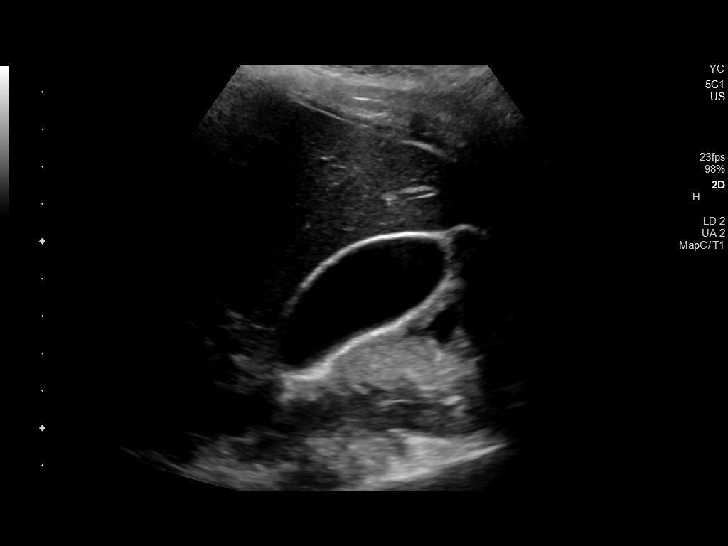
[im 3/36]
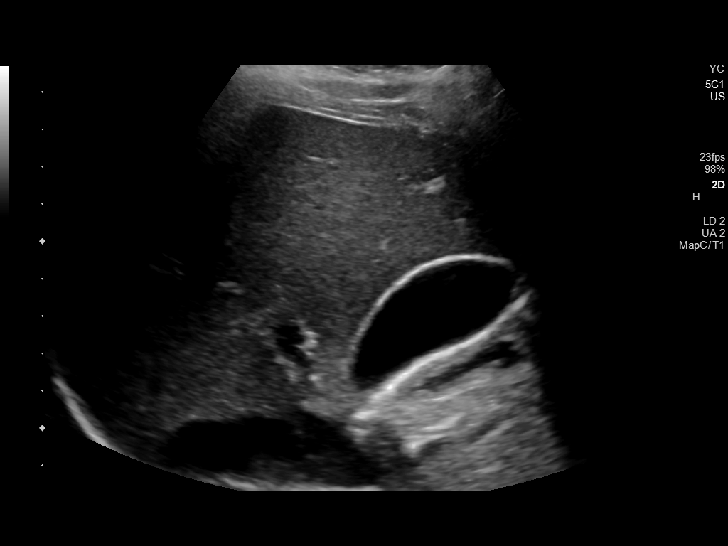
[im 6/36]
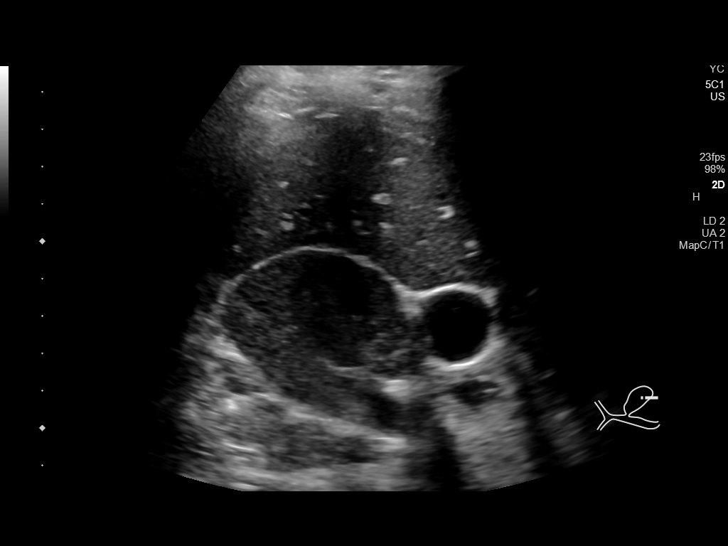
[im 9/36]
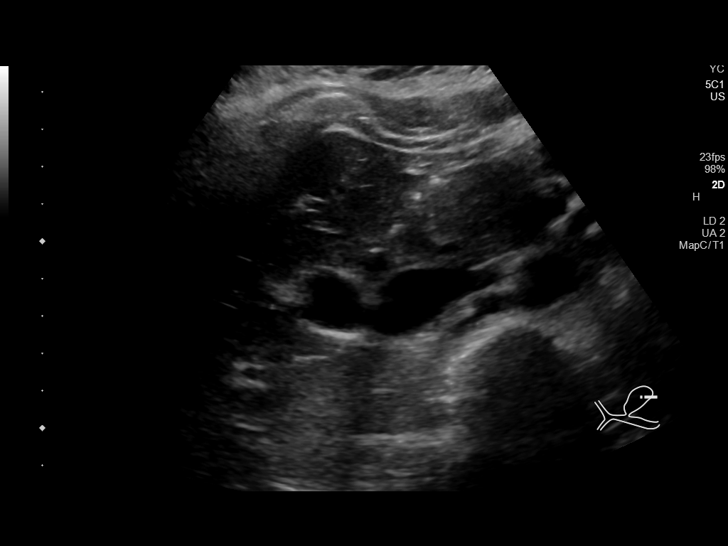
[im 12/36]
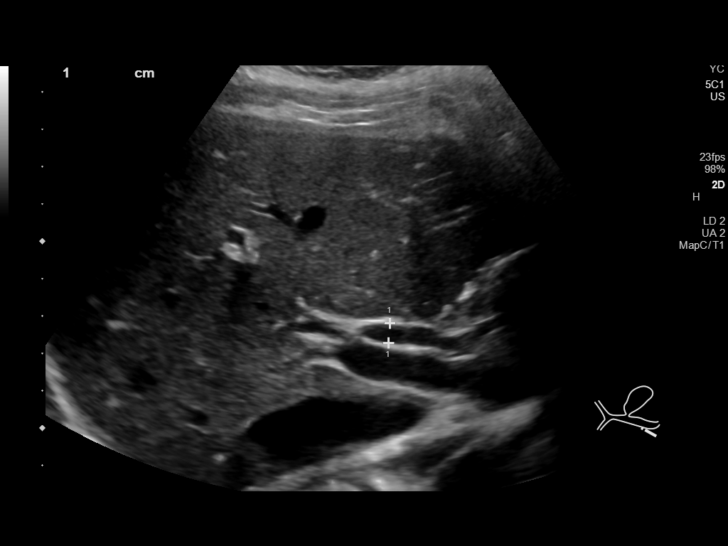
[im 14/36]
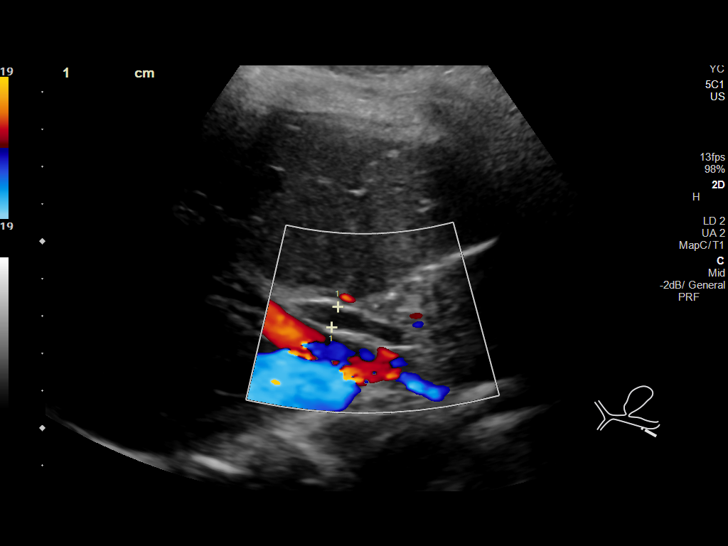
[im 17/36]
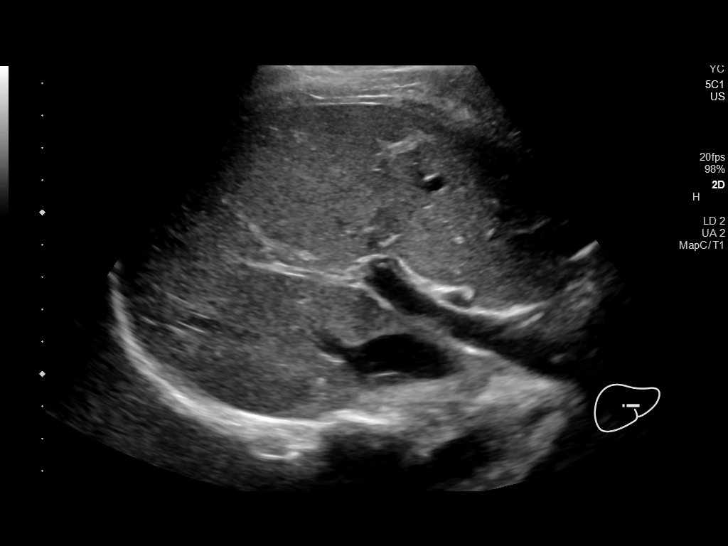
[im 19/36]
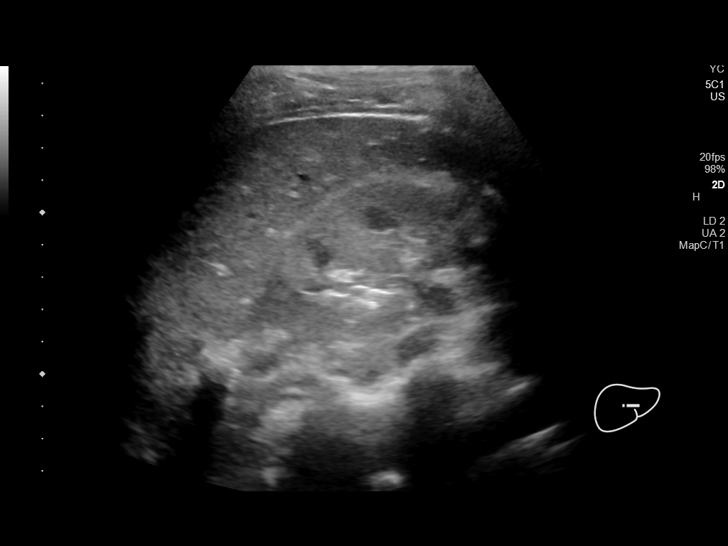
[im 22/36]
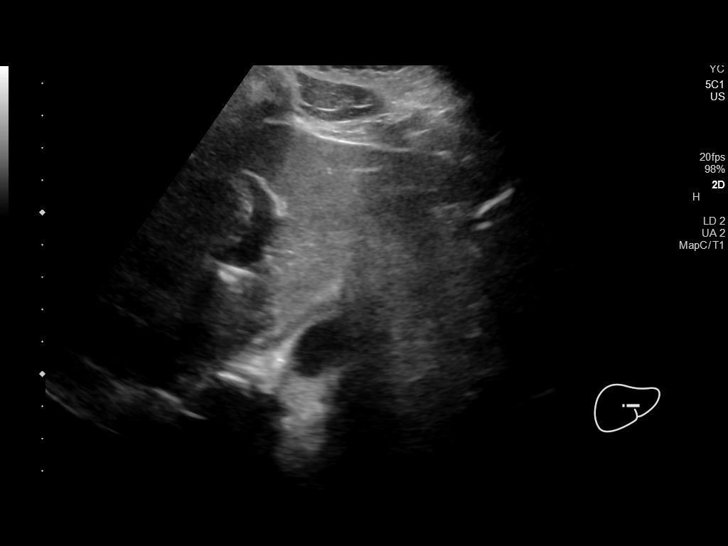
[im 24/36]
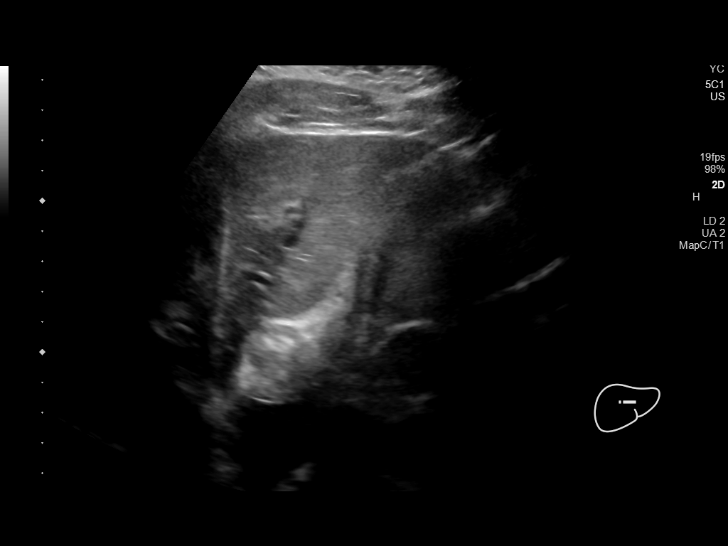
[im 27/36]
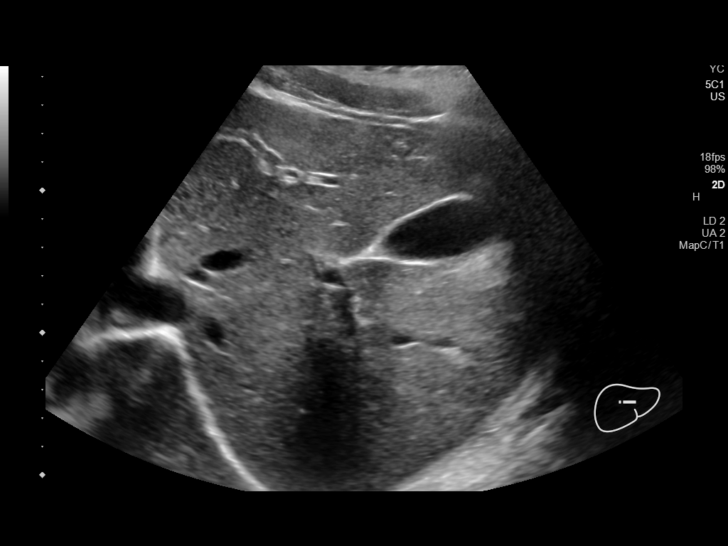
[im 30/36]
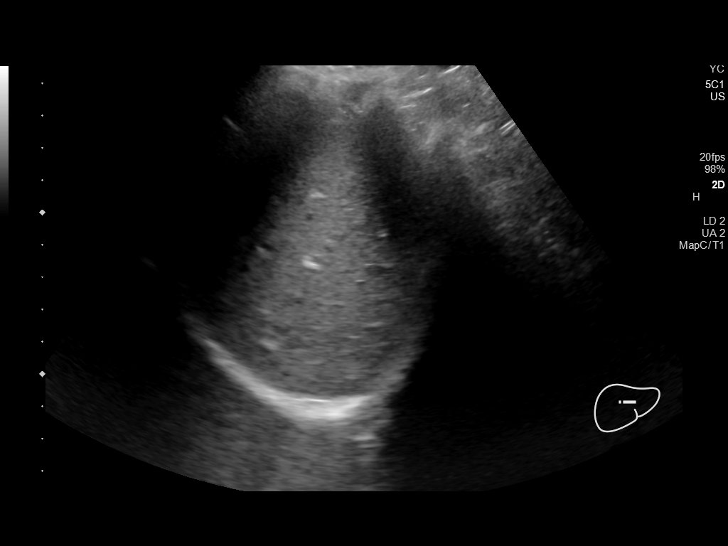
[im 33/36]
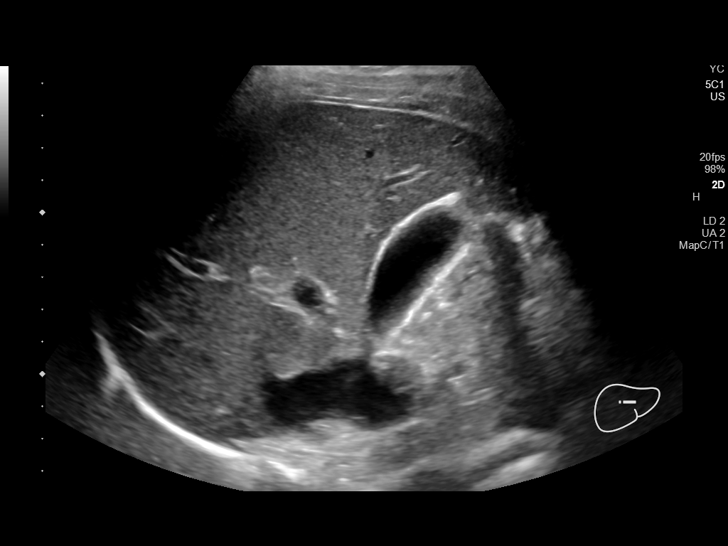
[im 36/36]
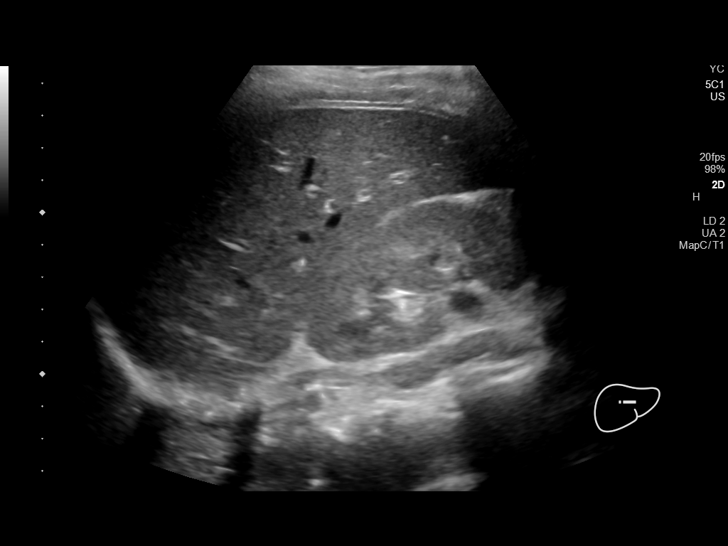

[14 of 25 positions shown; findings below may reference images not displayed]

FINDINGS: Gallbladder:

No gallstones or wall thickening visualized. No sonographic Murphy
sign noted by sonographer.

Common bile duct:

Diameter: 5-6 mm

Liver:

No focal lesion identified. Within normal limits in parenchymal
echogenicity. Portal vein is patent on color Doppler imaging with
normal direction of blood flow towards the liver.

Other: None.
IMPRESSION: Normal right upper quadrant sonogram

## 2021-12-29 ENCOUNTER — Telehealth: Payer: Self-pay | Admitting: Family

## 2021-12-30 ENCOUNTER — Encounter: Payer: Self-pay | Admitting: Family

## 2021-12-30 ENCOUNTER — Ambulatory Visit (INDEPENDENT_AMBULATORY_CARE_PROVIDER_SITE_OTHER): Payer: 59 | Admitting: Family

## 2021-12-30 DIAGNOSIS — F909 Attention-deficit hyperactivity disorder, unspecified type: Secondary | ICD-10-CM

## 2021-12-30 NOTE — Progress Notes (Signed)
Virtual Visit via Telephone Note  I connected with Kaitlyn York, on 12/30/2021 at 3:41 PM by telephone due to the COVID-19 pandemic and verified that I am speaking with the correct person using two identifiers.  Due to current restrictions/limitations of in-office visits due to the COVID-19 pandemic, this scheduled clinical appointment was converted to a telehealth visit.   Consent: I discussed the limitations, risks, security and privacy concerns of performing an evaluation and management service by telephone and the availability of in person appointments. I also discussed with the patient that there may be a patient responsible charge related to this service. The patient expressed understanding and agreed to proceed.   Location of Patient: Home  Location of Provider: [facility] [place] participating in Telemedicine visit: Kaitlyn York Durene Fruits, NP Elmon Else, CMA   History of Present Illness: Kaitlyn York is a 21 year-old female who presents for ADHD.   Would like to be tested for ADHD. Reports feeling like she has a tension problem. Challenges with college coursework, going to class, and concentrating. She is easily distracted. Reports dropped out of college 3 times and changed her major often. Doing well on her current job. However, difficulty in the past keeping a job. Bites nails.  Depression screen [facility] 2/9 12/30/2021 04/26/2021 03/01/2021 01/07/2021 11/18/2020  Decreased Interest 0 0 0 2 2  Down, Depressed, Hopeless 0 0 0 2 2  PHQ - 2 Score 0 0 0 4 4  Altered sleeping 0 0 1 2 3   Tired, decreased energy 0 0 2 2 3   Change in appetite 0 0 3 2 2   Feeling bad or failure about yourself  0 0 1 2 2   Trouble concentrating 0 0 0 3 3  Moving slowly or fidgety/restless 0 0 0 1 3  Suicidal thoughts 0 0 0 1 2  PHQ-9 Score 0 0 7 17 22   Difficult doing work/chores Not difficult at all Not difficult at all Somewhat difficult - -      History  reviewed. No pertinent past medical history. Allergies  Allergen Reactions   Doxycycline Nausea And Vomiting    Current Outpatient Medications on File Prior to Visit  Medication Sig Dispense Refill   amitriptyline (ELAVIL) 25 MG tablet Take 25 mg by mouth at bedtime. (Patient not taking: Reported on 07/07/2021)     etonogestrel (NEXPLANON) 68 MG IMPL implant 1 each by Subdermal route continuous.     ondansetron (ZOFRAN ODT) 4 MG disintegrating tablet Take 1 tablet (4 mg total) by mouth every 8 (eight) hours as needed for nausea or vomiting. (Patient not taking: Reported on 07/07/2021) 20 tablet 0   potassium chloride SA (KLOR-CON) 20 MEQ tablet Take 1 tablet (20 mEq total) by mouth 2 (two) times daily for 5 days. 10 tablet 0   promethazine (PHENERGAN) 25 MG suppository Place 1 suppository (25 mg total) rectally every 6 (six) hours as needed for nausea or vomiting. (Patient not taking: Reported on 07/07/2021) 12 each 0   promethazine (PHENERGAN) 25 MG tablet Take 1 tablet (25 mg total) by mouth every 6 (six) hours as needed for nausea or vomiting. (Patient not taking: Reported on 07/07/2021) 12 tablet 0   No current facility-administered medications on file prior to visit.    Observations/Objective: Alert and oriented x 3. Not in acute distress. Physical examination not completed as this is a telemedicine visit.  Assessment and Plan: 1. Attention deficit hyperactivity disorder (ADHD), unspecified ADHD type: -  Referral to Psychiatry for official ADHD screening.  - Follow-up with primary provider as scheduled. - Ambulatory referral to Psychiatry   Follow Up Instructions: Referral to Psychiatry. Follow-up with primary provider as scheduled.    Patient was given clear instructions to go to Emergency Department or return to medical center if symptoms don't improve, worsen, or new problems develop.The patient verbalized understanding.  I discussed the assessment and treatment plan with the patient.  The patient was provided an opportunity to ask questions and all were answered. The patient agreed with the plan and demonstrated an understanding of the instructions.   The patient was advised to call back or seek an in-person evaluation if the symptoms worsen or if the condition fails to improve as anticipated.    I provided 5 minutes total of non-face-to-face time during this encounter.   Camillia Herter, NP  [facility] [place], [place] 12/30/2021, 3:41 PM

## 2021-12-30 NOTE — Progress Notes (Signed)
Pt presents for telemedicine visit wanting to be tested for ADHD

## 2022-01-11 ENCOUNTER — Telehealth: Payer: Self-pay | Admitting: Family

## 2022-01-11 NOTE — Telephone Encounter (Signed)
Pt called in stating she wants to be tested for ADHD, the office she was referred to said they do not do that kind of testing. She is wanting to know the next steps

## 2022-01-12 ENCOUNTER — Other Ambulatory Visit: Payer: Self-pay | Admitting: Family

## 2022-01-12 DIAGNOSIS — F909 Attention-deficit hyperactivity disorder, unspecified type: Secondary | ICD-10-CM

## 2022-01-12 NOTE — Telephone Encounter (Signed)
Updated referral placed with Psychology. Allow 2 weeks for their office to call patient with appointment.

## 2022-04-22 NOTE — Progress Notes (Unsigned)
Patient ID: Kaitlyn York, female    DOB: Nov 22, 2001  MRN: [id number]  CC: Annual Physical Exam   Subjective: Kaitlyn York is a 21 y.o. female who presents for annual physical exam.   Her concerns today include: ***  Patient Active Problem List   Diagnosis Date Noted   Gonorrhea in female 01/10/2021   Trichomonas vaginitis 01/10/2021   Bacterial vaginitis 01/10/2021   Influenza vaccine refused 11/02/2020   History of herpes genitalis 11/02/2020   Major depressive disorder, single episode, moderate (HCC) 11/02/2020   Generalized anxiety disorder 11/02/2020   Over weight 11/02/2020   Current vaping on some days 11/02/2020   Elevated blood-pressure reading without diagnosis of hypertension 11/02/2020     Current Outpatient Medications on File Prior to Visit  Medication Sig Dispense Refill   amitriptyline (ELAVIL) 25 MG tablet Take 25 mg by mouth at bedtime. (Patient not taking: Reported on 07/07/2021)     etonogestrel (NEXPLANON) 68 MG IMPL implant 1 each by Subdermal route continuous.     ondansetron (ZOFRAN ODT) 4 MG disintegrating tablet Take 1 tablet (4 mg total) by mouth every 8 (eight) hours as needed for nausea or vomiting. (Patient not taking: Reported on 07/07/2021) 20 tablet 0   potassium chloride SA (KLOR-CON) 20 MEQ tablet Take 1 tablet (20 mEq total) by mouth 2 (two) times daily for 5 days. 10 tablet 0   promethazine (PHENERGAN) 25 MG suppository Place 1 suppository (25 mg total) rectally every 6 (six) hours as needed for nausea or vomiting. (Patient not taking: Reported on 07/07/2021) 12 each 0   promethazine (PHENERGAN) 25 MG tablet Take 1 tablet (25 mg total) by mouth every 6 (six) hours as needed for nausea or vomiting. (Patient not taking: Reported on 07/07/2021) 12 tablet 0   No current facility-administered medications on file prior to visit.    Allergies  Allergen Reactions   Doxycycline Nausea And Vomiting    Social History   Socioeconomic History   Marital  status: Single    Spouse name: Not on file   Number of children: 0   Years of education: Not on file   Highest education level: Some college, no degree  Occupational History   Not on file  Tobacco Use   Smoking status: Never   Smokeless tobacco: Never  Vaping Use   Vaping Use: Former   Substances: Nicotine  Substance and Sexual Activity   Alcohol use: Yes    Comment: 2 x a mth   Drug use: Yes    Frequency: 7.0 times per week    Types: Marijuana   Sexual activity: Yes    Birth control/protection: None  Other Topics Concern   Not on file  Social History Narrative   Not on file   Social Determinants of Health   Financial Resource Strain: Not on file  Food Insecurity: Not on file  Transportation Needs: Not on file  Physical Activity: Not on file  Stress: Not on file  Social Connections: Not on file  Intimate Partner Violence: Not on file    Family History  Problem Relation Age of Onset   Hypertension Maternal Aunt    Hypertension Maternal Uncle    Diabetes Maternal Grandmother    Hypertension Maternal Grandmother    Hypertension Mother     Past Surgical History:  Procedure Laterality Date   NO PAST SURGERIES      ROS: Review of Systems Negative except as stated above  PHYSICAL EXAM: There were no vitals  taken for this visit.  Physical Exam  {female adult master:310786} {female adult master:310785}     Latest Ref Rng & Units 04/26/2021    3:35 PM 03/17/2021   10:27 PM 03/01/2021    2:25 PM  CMP  Glucose 65 - 99 mg/dL 84   322   90    BUN 6 - 20 mg/dL 5   7   8     Creatinine 0.57 - 1.00 mg/dL   0.25   4.27    Sodium 134 - 144 mmol/L 139   139   138    Potassium 3.5 - 5.2 mmol/L 4.2   3.4   3.4    Chloride 96 - 106 mmol/L 102   106   103    CO2 20 - 29 mmol/L 22   22   23     Calcium 8.7 - 10.2 mg/dL 9.0   9.6   9.3    Total Protein 6.0 - 8.5 g/dL 6.5   7.8   7.4    Total Bilirubin 0.0 - 1.2 mg/dL 1.2   1.6   2.4    Alkaline Phos 44 - 121 IU/L 65    58   58    AST 0 - 40 IU/L 10   19   18     ALT 0 - 32 IU/L 8   15   13      Lipid Panel     Component Value Date/Time   CHOL 151 04/26/2021 1535   TRIG 84 04/26/2021 1535   HDL 57 04/26/2021 1535   CHOLHDL 2.6 04/26/2021 1535   LDLCALC 78 04/26/2021 1535    CBC    Component Value Date/Time   WBC 5.8 04/26/2021 1535   WBC 9.8 03/17/2021 2227   RBC 4.96 04/26/2021 1535   RBC 5.02 03/17/2021 2227   HGB 15.0 04/26/2021 1535   HCT 45.4 04/26/2021 1535   PLT 336 04/26/2021 1535   MCV 92 04/26/2021 1535   MCH 30.2 04/26/2021 1535   MCH 30.7 03/17/2021 2227   MCHC 33.0 04/26/2021 1535   MCHC 33.4 03/17/2021 2227   RDW 11.8 04/26/2021 1535   LYMPHSABS 1.4 03/01/2021 1425   MONOABS 1.0 03/01/2021 1425   EOSABS 0.1 03/01/2021 1425   BASOSABS 0.0 03/01/2021 1425    ASSESSMENT AND PLAN:  There are no diagnoses linked to this encounter.   Patient was given the opportunity to ask questions.  Patient verbalized understanding of the plan and was able to repeat key elements of the plan. Patient was given clear instructions to go to Emergency Department or return to medical center if symptoms don't improve, worsen, or new problems develop.The patient verbalized understanding.   No orders of the defined types were placed in this encounter.    Requested Prescriptions    No prescriptions requested or ordered in this encounter    No follow-ups on file.  03/03/2021, NP

## 2022-04-28 ENCOUNTER — Encounter: Payer: 59 | Admitting: Family

## 2022-04-28 DIAGNOSIS — Z0000 Encounter for general adult medical examination without abnormal findings: Secondary | ICD-10-CM

## 2022-04-28 DIAGNOSIS — Z1329 Encounter for screening for other suspected endocrine disorder: Secondary | ICD-10-CM

## 2022-04-28 DIAGNOSIS — Z131 Encounter for screening for diabetes mellitus: Secondary | ICD-10-CM

## 2022-04-28 DIAGNOSIS — Z130 Encounter for screening for diseases of the blood and blood-forming organs and certain disorders involving the immune mechanism: Secondary | ICD-10-CM

## 2022-04-28 DIAGNOSIS — Z13228 Encounter for screening for other metabolic disorders: Secondary | ICD-10-CM

## 2022-04-28 DIAGNOSIS — Z13220 Encounter for screening for lipoid disorders: Secondary | ICD-10-CM

## 2022-05-26 NOTE — Progress Notes (Signed)
Patient ID: Kaitlyn York, female    DOB: 02-24-2001  MRN: [id number]  CC: Annual Physical Exam   Subjective: Kaitlyn York is a 21 y.o. female who presents for annual physical exam.   Her concerns today include:  None. Plans to return tomorrow for fasting labs.   Patient Active Problem List   Diagnosis Date Noted   Gonorrhea in female 01/10/2021   Trichomonas vaginitis 01/10/2021   Bacterial vaginitis 01/10/2021   Influenza vaccine refused 11/02/2020   History of herpes genitalis 11/02/2020   Major depressive disorder, single episode, moderate ([place]) 11/02/2020   Generalized anxiety disorder 11/02/2020   Over weight 11/02/2020   Current vaping on some days 11/02/2020   Elevated blood-pressure reading without diagnosis of hypertension 11/02/2020     Current Outpatient Medications on File Prior to Visit  Medication Sig Dispense Refill   etonogestrel (NEXPLANON) 68 MG IMPL implant 1 each by Subdermal route continuous.     No current facility-administered medications on file prior to visit.    Allergies  Allergen Reactions   Doxycycline Nausea And Vomiting    Social History   Socioeconomic History   Marital status: Single    Spouse name: Not on file   Number of children: 0   Years of education: Not on file   Highest education level: Some college, no degree  Occupational History   Not on file  Tobacco Use   Smoking status: Never    Passive exposure: Current   Smokeless tobacco: Never  Vaping Use   Vaping Use: Former   Substances: Nicotine  Substance and Sexual Activity   Alcohol use: Yes    Comment: 2 x a mth   Drug use: Yes    Frequency: 7.0 times per week    Types: Marijuana   Sexual activity: Yes    Birth control/protection: None  Other Topics Concern   Not on file  Social History Narrative   Not on file   Social Determinants of Health   Financial Resource Strain: Not on file  Food Insecurity: Not on file  Transportation Needs: Not on file   Physical Activity: Not on file  Stress: Not on file  Social Connections: Not on file  Intimate Partner Violence: Not on file    Family History  Problem Relation Age of Onset   Hypertension Maternal Aunt    Hypertension Maternal Uncle    Diabetes Maternal Grandmother    Hypertension Maternal Grandmother    Hypertension Mother     Past Surgical History:  Procedure Laterality Date   NO PAST SURGERIES      ROS: Review of Systems Negative except as stated above  PHYSICAL EXAM: BP 120/78 (BP Location: Left Arm, Patient Position: Sitting, Cuff Size: Normal)   Pulse 94   Temp 98.3 F (36.8 C)   Resp 18   Ht 5' 4.02" (1.626 m)   Wt 157 lb (71.2 kg)   SpO2 98%   BMI 26.94 kg/m   Physical Exam HENT:     Head: Normocephalic and atraumatic.     Right Ear: Tympanic membrane, ear canal and external ear normal.     Left Ear: Tympanic membrane, ear canal and external ear normal.     Nose: Nose normal.     Mouth/Throat:     Mouth: Mucous membranes are moist.     Pharynx: Oropharynx is clear.  Eyes:     Extraocular Movements: Extraocular movements intact.     Conjunctiva/sclera: Conjunctivae normal.  Pupils: Pupils are equal, round, and reactive to light.  Cardiovascular:     Rate and Rhythm: Normal rate and regular rhythm.     Pulses: Normal pulses.     Heart sounds: Normal heart sounds.  Pulmonary:     Effort: Pulmonary effort is normal.     Breath sounds: Normal breath sounds.  Chest:     Comments: Patient declined.  Abdominal:     General: Bowel sounds are normal.     Palpations: Abdomen is soft.  Genitourinary:    Comments: Patient declined.  Musculoskeletal:        General: Normal range of motion.     Right shoulder: Normal.     Left shoulder: Normal.     Right upper arm: Normal.     Left upper arm: Normal.     Right elbow: Normal.     Left elbow: Normal.     Right forearm: Normal.     Left forearm: Normal.     Right wrist: Normal.     Left wrist:  Normal.     Right hand: Normal.     Left hand: Normal.     Cervical back: Normal, normal range of motion and neck supple.     Thoracic back: Normal.     Lumbar back: Normal.     Right hip: Normal.     Left hip: Normal.     Right upper leg: Normal.     Left upper leg: Normal.     Right knee: Normal.     Left knee: Normal.     Right lower leg: Normal.     Left lower leg: Normal.     Right ankle: Normal.     Left ankle: Normal.     Right foot: Normal.     Left foot: Normal.  Skin:    General: Skin is warm and dry.     Capillary Refill: Capillary refill takes less than 2 seconds.  Neurological:     General: No focal deficit present.     Mental Status: She is alert and oriented to person, place, and time.  Psychiatric:        Mood and Affect: Mood normal.        Behavior: Behavior normal.     ASSESSMENT AND PLAN: 1. Annual physical exam - Counseled on 150 minutes of exercise per week as tolerated, healthy eating (including decreased daily intake of saturated fats, cholesterol, added sugars, sodium), STI prevention, and routine healthcare maintenance.  2. Screening for metabolic disorder - VOZ36+UYQI to check kidney function, liver function, and electrolyte balance.  - CMP14+EGFR; Future  3. Screening for deficiency anemia - CBC to screen for anemia. - CBC; Future  4. Diabetes mellitus screening - Hemoglobin A1c to screen for pre-diabetes/diabetes. - Hemoglobin A1c; Future  5. Screening cholesterol level - Lipid panel to screen for high cholesterol.  - Lipid panel; Future  6. Thyroid disorder screen - TSH to check thyroid function.  - TSH; Future  7. Need for HPV vaccination - Administered today in office.  - HPV 9-valent vaccine,Recombinat   Patient was given the opportunity to ask questions.  Patient verbalized understanding of the plan and was able to repeat key elements of the plan. Patient was given clear instructions to go to Emergency Department or return  to medical center if symptoms don't improve, worsen, or new problems develop.The patient verbalized understanding.   Orders Placed This Encounter  Procedures   HPV 9-valent vaccine,Recombinat   CBC  CMP14+EGFR   Hemoglobin A1c   Lipid panel   TSH    Return in about 1 year (around 06/02/2023) for Physical per patient preference.  Camillia Herter, NP

## 2022-06-01 ENCOUNTER — Ambulatory Visit (INDEPENDENT_AMBULATORY_CARE_PROVIDER_SITE_OTHER): Payer: 59 | Admitting: Family

## 2022-06-01 ENCOUNTER — Encounter: Payer: Self-pay | Admitting: Family

## 2022-06-01 VITALS — BP 120/78 | HR 94 | Temp 98.3°F | Resp 18 | Ht 64.02 in | Wt 157.0 lb

## 2022-06-01 DIAGNOSIS — Z13228 Encounter for screening for other metabolic disorders: Secondary | ICD-10-CM

## 2022-06-01 DIAGNOSIS — Z13220 Encounter for screening for lipoid disorders: Secondary | ICD-10-CM

## 2022-06-01 DIAGNOSIS — Z0000 Encounter for general adult medical examination without abnormal findings: Secondary | ICD-10-CM

## 2022-06-01 DIAGNOSIS — Z23 Encounter for immunization: Secondary | ICD-10-CM

## 2022-06-01 DIAGNOSIS — Z1329 Encounter for screening for other suspected endocrine disorder: Secondary | ICD-10-CM

## 2022-06-01 DIAGNOSIS — Z130 Encounter for screening for diseases of the blood and blood-forming organs and certain disorders involving the immune mechanism: Secondary | ICD-10-CM

## 2022-06-01 DIAGNOSIS — Z131 Encounter for screening for diabetes mellitus: Secondary | ICD-10-CM

## 2022-06-01 NOTE — Patient Instructions (Signed)

## 2022-06-08 ENCOUNTER — Ambulatory Visit: Payer: 59 | Admitting: Obstetrics and Gynecology

## 2022-07-21 NOTE — Telephone Encounter (Signed)
Error

## 2023-03-10 ENCOUNTER — Other Ambulatory Visit: Payer: Self-pay

## 2023-03-10 ENCOUNTER — Emergency Department (HOSPITAL_COMMUNITY)
Admission: EM | Admit: 2023-03-10 | Discharge: 2023-03-10 | Disposition: A | Discharge status: Home / Self Care | Payer: 59 | Attending: Emergency Medicine | Admitting: Emergency Medicine

## 2023-03-10 ENCOUNTER — Encounter (HOSPITAL_COMMUNITY): Payer: Self-pay

## 2023-03-10 DIAGNOSIS — K2900 Acute gastritis without bleeding: Secondary | ICD-10-CM | POA: Diagnosis not present

## 2023-03-10 DIAGNOSIS — R1110 Vomiting, unspecified: Secondary | ICD-10-CM | POA: Diagnosis present

## 2023-03-10 LAB — COMPREHENSIVE METABOLIC PANEL
ALT: 18 U/L (ref 0–44)
AST: 19 U/L (ref 15–41)
Albumin: 4.8 g/dL (ref 3.5–5.0)
Alkaline Phosphatase: 69 U/L (ref 38–126)
Anion gap: 9 (ref 5–15)
BUN: 9 mg/dL (ref 6–20)
CO2: 22 mmol/L (ref 22–32)
Calcium: 9.2 mg/dL (ref 8.9–10.3)
Chloride: 105 mmol/L (ref 98–111)
Creatinine, Ser: 0.76 mg/dL (ref 0.44–1.00)
GFR, Estimated: >60 mL/min (ref >60)
Glucose, Bld: 116 mg/dL — ABNORMAL HIGH (ref 70–99)
Potassium: 3.4 mmol/L — ABNORMAL LOW (ref 3.5–5.1)
Sodium: 136 mmol/L (ref 135–145)
Total Bilirubin: 1.6 mg/dL — ABNORMAL HIGH (ref 0.3–1.2)
Total Protein: 8.1 g/dL (ref 6.5–8.1)

## 2023-03-10 LAB — URINALYSIS, ROUTINE W REFLEX MICROSCOPIC
Bilirubin Urine: NEGATIVE
Glucose, UA: NEGATIVE mg/dL
Hgb urine dipstick: NEGATIVE
Ketones, ur: 80 mg/dL — AB
Leukocytes,Ua: NEGATIVE
Nitrite: NEGATIVE
Protein, ur: 100 mg/dL — AB
Specific Gravity, Urine: 1.028 (ref 1.005–1.030)
pH: 7 (ref 5.0–8.0)

## 2023-03-10 LAB — CBC
HCT: 47 % — ABNORMAL HIGH (ref 36.0–46.0)
Hemoglobin: 15.7 g/dL — ABNORMAL HIGH (ref 12.0–15.0)
MCH: 29.8 pg (ref 26.0–34.0)
MCHC: 33.4 g/dL (ref 30.0–36.0)
MCV: 89.2 fL (ref 80.0–100.0)
Platelets: 420 10*3/uL — ABNORMAL HIGH (ref 150–400)
RBC: 5.27 MIL/uL — ABNORMAL HIGH (ref 3.87–5.11)
RDW: 12.8 % (ref 11.5–15.5)
WBC: 11.4 10*3/uL — ABNORMAL HIGH (ref 4.0–10.5)
nRBC: 0 % (ref 0.0–0.2)

## 2023-03-10 LAB — I-STAT BETA HCG BLOOD, ED (MC, WL, AP ONLY): I-stat hCG, quantitative: <5 m[IU]/mL (ref <5)

## 2023-03-10 LAB — LIPASE, BLOOD: Lipase: 26 U/L (ref 11–51)

## 2023-03-10 MED ORDER — AZITHROMYCIN 1 G PO PACK
1.0000 g | PACK | Freq: Once | ORAL | Status: AC
  Administered 2023-03-10: 1 g via ORAL
  Filled 2023-03-10: qty 1

## 2023-03-10 MED ORDER — AZITHROMYCIN 500 MG PO TABS: ORAL_TABLET | ORAL | 0 refills | Status: AC

## 2023-03-10 MED ORDER — ONDANSETRON 4 MG PO TBDP: ORAL_TABLET | ORAL | 0 refills | Status: DC

## 2023-03-10 NOTE — ED Triage Notes (Addendum)
Pt coming in today complaining of nausea/emesis for 3-4 days, also complaining of burning from acid reflux and diarrhea. No relief from antiemetic medications. Pt also states she began taking a doxycyline abx Thursday, and s/s began Friday morning (check allergies)

## 2023-03-10 NOTE — Discharge Instructions (Signed)
Follow up as needed

## 2023-03-10 NOTE — ED Provider Notes (Signed)
[place] [facility] Provider Note   CSN: [id number] Arrival date & time: 03/10/23  [id number]     History {Add pertinent medical, surgical, social history, OB history to HPI:1} Chief Complaint  Patient presents with   Emesis    Varney DailyDiamond Chirino is a 22 y.o. female.  Patient is on doxycycline for chlamydia.  She has been vomiting ever since taking the medicine   Emesis      Home Medications Prior to Admission medications   Medication Sig Start Date End Date Taking? Authorizing Provider  azithromycin (ZITHROMAX) 500 MG tablet Take 1 pill tomorrow and 1 pill the next day 03/10/23  Yes Bethann BerkshireZammit, Magan Winnett, MD  ondansetron (ZOFRAN-ODT) 4 MG disintegrating tablet 4mg  ODT q4 hours prn nausea/vomit 03/10/23  Yes Bethann BerkshireZammit, Doneen Ollinger, MD  etonogestrel (NEXPLANON) 68 MG IMPL implant 1 each by Subdermal route continuous.    [provider]      Allergies    Doxycycline    Review of Systems   Review of Systems  Gastrointestinal:  Positive for vomiting.    Physical Exam Updated Vital Signs BP (!) 147/77 (BP Location: Left Arm)   Pulse 72   Temp 98.5 F (36.9 C) (Oral)   Resp 16   Ht 5\' 4"  (1.626 m)   Wt 78.9 kg   SpO2 100%   BMI 29.87 kg/m  Physical Exam  ED Results / Procedures / Treatments   Labs (all labs ordered are listed, but only abnormal results are displayed) Labs Reviewed  COMPREHENSIVE METABOLIC PANEL - Abnormal; Notable for the following components:      Result Value   Potassium 3.4 (*)    Glucose, Bld 116 (*)    Total Bilirubin 1.6 (*)    All other components within normal limits  CBC - Abnormal; Notable for the following components:   WBC 11.4 (*)    RBC 5.27 (*)    Hemoglobin 15.7 (*)    HCT 47.0 (*)    Platelets 420 (*)    All other components within normal limits  URINALYSIS, ROUTINE W REFLEX MICROSCOPIC - Abnormal; Notable for the following components:   Ketones, ur 80 (*)    Protein, ur 100 (*)    Bacteria, UA RARE  (*)    All other components within normal limits  LIPASE, BLOOD  I-STAT BETA HCG BLOOD, ED ([facility], [facility], [facility] ONLY)    EKG None  Radiology No results found.  Procedures Procedures  {Document cardiac monitor, telemetry assessment procedure when appropriate:1}  Medications Ordered in ED Medications  azithromycin (ZITHROMAX) powder 1 g (has no administration in time range)    ED Course/ Medical Decision Making/ A&P   {   Click here for ABCD2, HEART and other calculatorsREFRESH Note before signing :1}                          Medical Decision Making Amount and/or Complexity of Data Reviewed Labs: ordered.  Risk Prescription drug management.   Patient with gastritis from doxycycline.  She will stop the doxycycline and is given Zithromax for the chlamydia  {Document critical care time when appropriate:1} {Document review of labs and clinical decision tools ie heart score, Chads2Vasc2 etc:1}  {Document your independent review of radiology images, and any outside records:1} {Document your discussion with family members, caretakers, and with consultants:1} {Document social determinants of health affecting pt's care:1} {Document your decision making why or why not admission, treatments were needed:1}  Final Clinical Impression(s) / ED Diagnoses Final diagnoses:  Acute superficial gastritis without hemorrhage    Rx / DC Orders ED Discharge Orders          Ordered    azithromycin (ZITHROMAX) 500 MG tablet        03/10/23 0955    ondansetron (ZOFRAN-ODT) 4 MG disintegrating tablet        03/10/23 0955

## 2023-06-12 ENCOUNTER — Ambulatory Visit
Admission: RE | Admit: 2023-06-12 | Discharge: 2023-06-12 | Disposition: A | Discharge status: Home / Self Care | Payer: 59 | Source: Ambulatory Visit | Attending: Internal Medicine | Admitting: Internal Medicine

## 2023-06-12 VITALS — BP 137/74 | HR 87 | Temp 98.4°F | Resp 16

## 2023-06-12 DIAGNOSIS — N760 Acute vaginitis: Secondary | ICD-10-CM | POA: Insufficient documentation

## 2023-06-12 DIAGNOSIS — R82998 Other abnormal findings in urine: Secondary | ICD-10-CM | POA: Diagnosis not present

## 2023-06-12 LAB — POCT URINALYSIS DIP (MANUAL ENTRY)
Bilirubin, UA: NEGATIVE
Blood, UA: NEGATIVE
Glucose, UA: NEGATIVE mg/dL
Ketones, POC UA: NEGATIVE mg/dL
Nitrite, UA: NEGATIVE
Protein Ur, POC: NEGATIVE mg/dL
Spec Grav, UA: 1.015 (ref 1.010–1.025)
Urobilinogen, UA: 0.2 E.U./dL
pH, UA: 6.5 (ref 5.0–8.0)

## 2023-06-12 LAB — POCT URINE PREGNANCY: Preg Test, Ur: NEGATIVE

## 2023-06-12 MED ORDER — METRONIDAZOLE 500 MG PO TABS
500.0000 mg | ORAL_TABLET | Freq: Two times a day (BID) | ORAL | 0 refills | Status: AC
Start: 2023-06-12 — End: ?

## 2023-06-12 NOTE — ED Triage Notes (Signed)
Pt presents to UC w/ c/o vaginal itching x2 days. Denies abd pain, discharge, or odor.

## 2023-06-12 NOTE — ED Provider Notes (Signed)
[facility]    CSN: [id number] Arrival date & time: 06/12/23  1058      History   Chief Complaint Chief Complaint  Patient presents with   SEXUALLY TRANSMITTED DISEASE    Entered by patient    HPI Kaitlyn York is a 22 y.o. female presents for evaluation of vaginal itching.  Patient reports 2 days of vaginal itching/discomfort.  States she has some mild discharge but nothing significant.  Denies any dysuria, fevers, nausea/vomiting, flank pain.  No abdominal pain.  No STD exposure but would like screening.  Does have a history of BV and states this feels similar.  No OTC medications have been used since onset.  No other concerns at this time.  HPI  History reviewed. No pertinent past medical history.  Patient Active Problem List   Diagnosis Date Noted   Gonorrhea in female 01/10/2021   Trichomonas vaginitis 01/10/2021   Bacterial vaginitis 01/10/2021   Influenza vaccine refused 11/02/2020   History of herpes genitalis 11/02/2020   Major depressive disorder, single episode, moderate (HCC) 11/02/2020   Generalized anxiety disorder 11/02/2020   Over weight 11/02/2020   Current vaping on some days 11/02/2020   Elevated blood-pressure reading without diagnosis of hypertension 11/02/2020    Past Surgical History:  Procedure Laterality Date   NO PAST SURGERIES      OB History   No obstetric history on file.      Home Medications    Prior to Admission medications   Medication Sig Start Date End Date Taking? Authorizing Provider  metroNIDAZOLE (FLAGYL) 500 MG tablet Take 1 tablet (500 mg total) by mouth 2 (two) times daily. 06/12/23  Yes Radford Pax, NP  azithromycin (ZITHROMAX) 500 MG tablet Take 1 pill tomorrow and 1 pill the next day 03/10/23   Bethann Berkshire, MD  etonogestrel (NEXPLANON) 68 MG IMPL implant 1 each by Subdermal route continuous.    [provider]  ondansetron (ZOFRAN-ODT) 4 MG disintegrating tablet 4mg  ODT q4 hours prn nausea/vomit  03/10/23   Bethann Berkshire, MD    Family History Family History  Problem Relation Age of Onset   Hypertension Maternal Aunt    Hypertension Maternal Uncle    Diabetes Maternal Grandmother    Hypertension Maternal Grandmother    Hypertension Mother     Social History Social History   Tobacco Use   Smoking status: Never    Passive exposure: Current   Smokeless tobacco: Never  Vaping Use   Vaping Use: Former   Substances: Nicotine, Mixture of cannabinoids  Substance Use Topics   Alcohol use: Yes    Comment: 2 x a mth   Drug use: Yes    Frequency: 7.0 times per week    Types: Marijuana     Allergies   Doxycycline   Review of Systems Review of Systems  Genitourinary:  Positive for vaginal discharge.     Physical Exam Triage Vital Signs ED Triage Vitals  Enc Vitals Group     BP 06/12/23 1107 137/74     Pulse Rate 06/12/23 1107 87     Resp 06/12/23 1107 16     Temp 06/12/23 1107 98.4 F (36.9 C)     Temp Source 06/12/23 1107 Oral     SpO2 06/12/23 1107 98 %     Weight --      Height --      Head Circumference --      Peak Flow --      Pain  Score 06/12/23 1111 0     Pain Loc --      Pain Edu? --      Excl. in GC? --    No data found.  Updated Vital Signs BP 137/74 (BP Location: Right Arm)   Pulse 87   Temp 98.4 F (36.9 C) (Oral)   Resp 16   LMP 05/05/2023 (Approximate)   SpO2 98%   Visual Acuity Right Eye Distance:   Left Eye Distance:   Bilateral Distance:    Right Eye Near:   Left Eye Near:    Bilateral Near:     Physical Exam Vitals and nursing note reviewed.  Constitutional:      Appearance: Normal appearance.  HENT:     Head: Normocephalic and atraumatic.  Eyes:     Pupils: Pupils are equal, round, and reactive to light.  Cardiovascular:     Rate and Rhythm: Normal rate.  Pulmonary:     Effort: Pulmonary effort is normal.  Abdominal:     Tenderness: There is no right CVA tenderness or left CVA tenderness.  Skin:    General:  Skin is warm and dry.  Neurological:     General: No focal deficit present.     Mental Status: She is alert and oriented to person, place, and time.  Psychiatric:        Mood and Affect: Mood normal.        Behavior: Behavior normal.      UC Treatments / Results  Labs (all labs ordered are listed, but only abnormal results are displayed) Labs Reviewed  POCT URINALYSIS DIP (MANUAL ENTRY) - Abnormal; Notable for the following components:      Result Value   Leukocytes, UA Trace (*)    All other components within normal limits  URINE CULTURE  POCT URINE PREGNANCY  CERVICOVAGINAL ANCILLARY ONLY    EKG   Radiology No results found.  Procedures Procedures (including critical care time)  Medications Ordered in UC Medications - No data to display  Initial Impression / Assessment and Plan / UC Course  I have reviewed the triage vital signs and the nursing notes.  Pertinent labs & imaging results that were available during my care of the patient were reviewed by me and considered in my medical decision making (see chart for details).     Reviewed exam and symptoms with patient.  No red flags.  UA with trace leuks, patient asymptomatic.  Will culture and contact if positive.  STD testing is ordered and will contact for any positive results.  As patient reports symptoms consistent with BV will start Flagyl.  Side effect profile reviewed.  PCP follow-up if symptoms do not improve.  ER precautions reviewed and patient verbalized understanding Final Clinical Impressions(s) / UC Diagnoses   Final diagnoses:  Acute vaginitis  Leukocytes in urine     Discharge Instructions      Start metronidazole twice daily for 7 days.  The clinic will contact you with results of the testing done today if positive.  Please follow-up with your PCP if your symptoms do not improve.  Please go to the emergency room for any worsening symptoms.  I hope you feel better soon!   ED Prescriptions      Medication Sig Dispense Auth. Provider   metroNIDAZOLE (FLAGYL) 500 MG tablet Take 1 tablet (500 mg total) by mouth 2 (two) times daily. 14 tablet Radford Pax, NP      PDMP not reviewed this encounter.  Radford Pax, NP 06/12/23 [phone]

## 2023-06-12 NOTE — Discharge Instructions (Signed)
Start metronidazole twice daily for 7 days.  The clinic will contact you with results of the testing done today if positive.  Please follow-up with your PCP if your symptoms do not improve.  Please go to the emergency room for any worsening symptoms.  I hope you feel better soon!

## 2023-06-13 LAB — CERVICOVAGINAL ANCILLARY ONLY
Bacterial Vaginitis (gardnerella): POSITIVE — AB
Candida Glabrata: NEGATIVE
Candida Vaginitis: POSITIVE — AB
Chlamydia: NEGATIVE
Comment: NEGATIVE
Comment: NEGATIVE
Comment: NEGATIVE
Comment: NEGATIVE
Comment: NEGATIVE
Comment: NORMAL
Neisseria Gonorrhea: NEGATIVE
Trichomonas: POSITIVE — AB

## 2023-06-13 LAB — URINE CULTURE: Culture: NO GROWTH

## 2023-06-14 ENCOUNTER — Telehealth (HOSPITAL_COMMUNITY): Payer: Self-pay | Admitting: Emergency Medicine

## 2023-06-14 MED ORDER — FLUCONAZOLE 150 MG PO TABS: 150.0000 mg | ORAL_TABLET | Freq: Once | ORAL | 0 refills | Status: AC

## 2023-07-27 ENCOUNTER — Ambulatory Visit: Payer: 59 | Admitting: Family

## 2023-08-13 ENCOUNTER — Encounter: Payer: 59 | Admitting: Family

## 2023-08-13 NOTE — Progress Notes (Signed)
I attempted to connect with patient via telemedicine video visit at 3:47 pm without success.

## 2023-09-25 ENCOUNTER — Encounter: Payer: 59 | Admitting: Family

## 2023-12-12 ENCOUNTER — Encounter: Payer: 59 | Admitting: Family

## 2024-03-05 ENCOUNTER — Ambulatory Visit (INDEPENDENT_AMBULATORY_CARE_PROVIDER_SITE_OTHER): Payer: 59 | Admitting: Family

## 2024-03-05 VITALS — BP 130/82 | HR 82 | Temp 98.4°F | Ht 64.5 in | Wt 174.0 lb

## 2024-03-05 DIAGNOSIS — Z0000 Encounter for general adult medical examination without abnormal findings: Secondary | ICD-10-CM

## 2024-03-05 DIAGNOSIS — Z13220 Encounter for screening for lipoid disorders: Secondary | ICD-10-CM

## 2024-03-05 DIAGNOSIS — Z131 Encounter for screening for diabetes mellitus: Secondary | ICD-10-CM

## 2024-03-05 DIAGNOSIS — Z130 Encounter for screening for diseases of the blood and blood-forming organs and certain disorders involving the immune mechanism: Secondary | ICD-10-CM

## 2024-03-05 DIAGNOSIS — Z13228 Encounter for screening for other metabolic disorders: Secondary | ICD-10-CM

## 2024-03-05 DIAGNOSIS — Z1329 Encounter for screening for other suspected endocrine disorder: Secondary | ICD-10-CM

## 2024-03-05 NOTE — Progress Notes (Signed)
 Patient states nothing to discuss.

## 2024-03-05 NOTE — Progress Notes (Signed)
 Patient ID: Kaitlyn York, female    DOB: 2001-07-10  MRN: [id number]  CC: Annual Exam  Subjective: Kaitlyn York is a 23 y.o. female who presents for annual exam.   Her concerns today include:  - None.  Patient Active Problem List   Diagnosis Date Noted   Gonorrhea in female 01/10/2021   Trichomonas vaginitis 01/10/2021   Bacterial vaginitis 01/10/2021   Influenza vaccine refused 11/02/2020   History of herpes genitalis 11/02/2020   Major depressive disorder, single episode, moderate (HCC) 11/02/2020   Generalized anxiety disorder 11/02/2020   Over weight 11/02/2020   Current vaping on some days 11/02/2020   Elevated blood-pressure reading without diagnosis of hypertension 11/02/2020     Current Outpatient Medications on File Prior to Visit  Medication Sig Dispense Refill   azithromycin (ZITHROMAX) 500 MG tablet Take 1 pill tomorrow and 1 pill the next day (Patient not taking: Reported on 03/05/2024) 2 tablet 0   etonogestrel (NEXPLANON) 68 MG IMPL implant 1 each by Subdermal route continuous.     metroNIDAZOLE (FLAGYL) 500 MG tablet Take 1 tablet (500 mg total) by mouth 2 (two) times daily. (Patient not taking: Reported on 03/05/2024) 14 tablet 0   ondansetron (ZOFRAN-ODT) 4 MG disintegrating tablet 4mg  ODT q4 hours prn nausea/vomit (Patient not taking: Reported on 03/05/2024) 12 tablet 0   No current facility-administered medications on file prior to visit.    Allergies  Allergen Reactions   Doxycycline Nausea And Vomiting    Social History   Socioeconomic History   Marital status: Single    Spouse name: Not on file   Number of children: 0   Years of education: Not on file   Highest education level: Some college, no degree  Occupational History   Not on file  Tobacco Use   Smoking status: Never    Passive exposure: Current   Smokeless tobacco: Never  Vaping Use   Vaping status: Former   Substances: Nicotine, Mixture of cannabinoids  Substance and Sexual  Activity   Alcohol use: Yes    Comment: 2 x a mth   Drug use: Yes    Frequency: 7.0 times per week    Types: Marijuana   Sexual activity: Yes    Birth control/protection: None  Other Topics Concern   Not on file  Social History Narrative   Not on file   Social Drivers of Health   Financial Resource Strain: Low Risk  (03/05/2024)   Overall Financial Resource Strain (CARDIA)    Difficulty of Paying Living Expenses: Not very hard  Food Insecurity: Food Insecurity Present (03/05/2024)   Hunger Vital Sign    Worried About Running Out of Food in the Last Year: Sometimes true    Ran Out of Food in the Last Year: Sometimes true  Transportation Needs: No Transportation Needs (03/05/2024)   PRAPARE - Administrator, Civil Service (Medical): No    Lack of Transportation (Non-Medical): No  Physical Activity: Insufficiently Active (03/05/2024)   Exercise Vital Sign    Days of Exercise per Week: 5 days    Minutes of Exercise per Session: 10 min  Stress: No Stress Concern Present (03/05/2024)   [employer] of Occupational Health - Occupational Stress Questionnaire    Feeling of Stress : Only a little  Social Connections: Moderately Isolated (03/05/2024)   Social Connection and Isolation Panel [NHANES]    Frequency of Communication with Friends and Family: More than three times a week  Frequency of Social Gatherings with Friends and Family: More than three times a week    Attends Religious Services: 1 to 4 times per year    Active Member of [employer] or Organizations: No    Attends Engineer, structural: Not on file    Marital Status: Never married  Intimate Partner Violence: Unknown (02/23/2023)   Received from [employer], [facility]   HITS    Physically Hurt: Not on file    Insult or Talk Down To: Not on file    Threaten Physical Harm: Not on file    Scream or Curse: Not on file    Family History  Problem Relation Age of Onset   Hypertension Maternal Aunt     Hypertension Maternal Uncle    Diabetes Maternal Grandmother    Hypertension Maternal Grandmother    Hypertension Mother     Past Surgical History:  Procedure Laterality Date   NO PAST SURGERIES      ROS: Review of Systems Negative except as stated above  PHYSICAL EXAM: BP 130/82   Pulse 82   Temp 98.4 F (36.9 C) (Oral)   Ht 5' 4.5" (1.638 m)   Wt 174 lb (78.9 kg)   LMP 02/28/2024 (Exact Date)   SpO2 98%   BMI 29.41 kg/m   Physical Exam HENT:     Head: Normocephalic and atraumatic.     Right Ear: Tympanic membrane, ear canal and external ear normal.     Left Ear: Tympanic membrane, ear canal and external ear normal.     Nose: Nose normal.     Mouth/Throat:     Mouth: Mucous membranes are moist.     Pharynx: Oropharynx is clear.  Eyes:     Extraocular Movements: Extraocular movements intact.     Conjunctiva/sclera: Conjunctivae normal.     Pupils: Pupils are equal, round, and reactive to light.  Neck:     Thyroid: No thyroid mass, thyromegaly or thyroid tenderness.  Cardiovascular:     Rate and Rhythm: Normal rate and regular rhythm.     Pulses: Normal pulses.     Heart sounds: Normal heart sounds.  Pulmonary:     Effort: Pulmonary effort is normal.     Breath sounds: Normal breath sounds.  Chest:     Comments: Patient declined. Abdominal:     General: Bowel sounds are normal.     Palpations: Abdomen is soft.  Genitourinary:    Comments: Patient declined. Musculoskeletal:        General: Normal range of motion.     Right shoulder: Normal.     Left shoulder: Normal.     Right upper arm: Normal.     Left upper arm: Normal.     Right elbow: Normal.     Left elbow: Normal.     Right forearm: Normal.     Left forearm: Normal.     Right wrist: Normal.     Left wrist: Normal.     Right hand: Normal.     Left hand: Normal.     Cervical back: Normal, normal range of motion and neck supple.     Thoracic back: Normal.     Lumbar back: Normal.     Right  hip: Normal.     Left hip: Normal.     Right upper leg: Normal.     Left upper leg: Normal.     Right knee: Normal.     Left knee: Normal.  Right lower leg: Normal.     Left lower leg: Normal.     Right ankle: Normal.     Left ankle: Normal.     Right foot: Normal.     Left foot: Normal.  Skin:    General: Skin is warm and dry.     Capillary Refill: Capillary refill takes less than 2 seconds.  Neurological:     General: No focal deficit present.     Mental Status: She is alert and oriented to person, place, and time.  Psychiatric:        Mood and Affect: Mood normal.        Behavior: Behavior normal.    ASSESSMENT AND PLAN: 1. Annual physical exam (Primary) - Counseled on 150 minutes of exercise per week as tolerated, healthy eating (including decreased daily intake of saturated fats, cholesterol, added sugars, sodium), STI prevention, and routine healthcare maintenance.  2. Screening for metabolic disorder - Routine screening.  - CMP14+EGFR  3. Screening for deficiency anemia - Routine screening.  - CBC  4. Diabetes mellitus screening - Routine screening.  - Hemoglobin A1c  5. Screening cholesterol level - Routine screening.  - Lipid panel  6. Thyroid disorder screen - Routine screening.  - TSH    Patient was given the opportunity to ask questions.  Patient verbalized understanding of the plan and was able to repeat key elements of the plan. Patient was given clear instructions to go to Emergency Department or return to medical center if symptoms don't improve, worsen, or new problems develop.The patient verbalized understanding.   Orders Placed This Encounter  Procedures   CBC   Lipid panel   CMP14+EGFR   Hemoglobin A1c   TSH    Return in about 1 year (around 03/05/2025) for Physical per patient preference.  Rema Fendt, NP

## 2024-03-06 ENCOUNTER — Encounter: Payer: Self-pay | Admitting: Family

## 2024-03-06 LAB — CMP14+EGFR
ALT: 8 IU/L (ref 0–32)
AST: 19 IU/L (ref 0–40)
Albumin: 4.5 g/dL (ref 4.0–5.0)
Alkaline Phosphatase: 85 IU/L (ref 44–121)
BUN/Creatinine Ratio: 6 — ABNORMAL LOW (ref 9–23)
BUN: 5 mg/dL — ABNORMAL LOW (ref 6–20)
Bilirubin Total: 0.4 mg/dL (ref 0.0–1.2)
CO2: 24 mmol/L (ref 20–29)
Calcium: 9.4 mg/dL (ref 8.7–10.2)
Chloride: 104 mmol/L (ref 96–106)
Creatinine, Ser: 0.79 mg/dL (ref 0.57–1.00)
Globulin, Total: 2.7 g/dL (ref 1.5–4.5)
Glucose: 75 mg/dL (ref 70–99)
Potassium: 4.3 mmol/L (ref 3.5–5.2)
Sodium: 142 mmol/L (ref 134–144)
Total Protein: 7.2 g/dL (ref 6.0–8.5)
eGFR: 108 mL/min/{1.73_m2} (ref >59)

## 2024-03-06 LAB — CBC
Hematocrit: 45.5 % (ref 34.0–46.6)
Hemoglobin: 14.6 g/dL (ref 11.1–15.9)
MCH: 29.4 pg (ref 26.6–33.0)
MCHC: 32.1 g/dL (ref 31.5–35.7)
MCV: 92 fL (ref 79–97)
Platelets: 366 10*3/uL (ref 150–450)
RBC: 4.96 x10E6/uL (ref 3.77–5.28)
RDW: 12 % (ref 11.7–15.4)
WBC: 7.2 10*3/uL (ref 3.4–10.8)

## 2024-03-06 LAB — HEMOGLOBIN A1C
Est. average glucose Bld gHb Est-mCnc: 94 mg/dL
Hgb A1c MFr Bld: 4.9 % (ref 4.8–5.6)

## 2024-03-06 LAB — LIPID PANEL
Chol/HDL Ratio: 2.3 ratio (ref 0.0–4.4)
Cholesterol, Total: 175 mg/dL (ref 100–199)
HDL: 77 mg/dL (ref >39)
LDL Chol Calc (NIH): 88 mg/dL (ref 0–99)
Triglycerides: 49 mg/dL (ref 0–149)
VLDL Cholesterol Cal: 10 mg/dL (ref 5–40)

## 2024-03-06 LAB — TSH: TSH: 0.603 u[IU]/mL (ref 0.450–4.500)

## 2024-09-08 ENCOUNTER — Ambulatory Visit
Admission: RE | Admit: 2024-09-08 | Discharge: 2024-09-08 | Disposition: A | Discharge status: Home / Self Care | Source: Ambulatory Visit | Attending: Family Medicine | Admitting: Family Medicine

## 2024-09-08 VITALS — BP 149/92 | HR 96 | Temp 99.8°F | Resp 20

## 2024-09-08 DIAGNOSIS — Z113 Encounter for screening for infections with a predominantly sexual mode of transmission: Secondary | ICD-10-CM | POA: Insufficient documentation

## 2024-09-08 DIAGNOSIS — Z202 Contact with and (suspected) exposure to infections with a predominantly sexual mode of transmission: Secondary | ICD-10-CM | POA: Insufficient documentation

## 2024-09-08 MED ORDER — ONDANSETRON 8 MG PO TBDP: 8.0000 mg | ORAL_TABLET | Freq: Two times a day (BID) | ORAL | 0 refills | Status: AC | PRN

## 2024-09-08 MED ORDER — DOXYCYCLINE HYCLATE 100 MG PO CAPS: 100.0000 mg | ORAL_CAPSULE | Freq: Two times a day (BID) | ORAL | 0 refills | Status: AC

## 2024-09-08 NOTE — ED Triage Notes (Signed)
 Pt requesting STD testing-denies sx-reports +chlamydia exposure-NAD-steady gait

## 2024-09-08 NOTE — Discharge Instructions (Addendum)
 Avoid all forms of sexual intercourse (oral, vaginal, anal) for the next 7 days to avoid spreading/reinfecting or at least until we can see what kinds of infection results are positive.  Abstaining for 2 weeks would be better but at least 1 week is required.  We will let you know about your test results from the swab we did today and if you need any prescriptions for antibiotics or changes to your treatment from today.

## 2024-09-08 NOTE — ED Provider Notes (Signed)
 [facility]  Note:  This document was prepared using Conservation officer, historic buildings and may include unintentional dictation errors.  MRN: [id number] DOB: 09-14-2001  Subjective:   Kaitlyn York is a 23 y.o. female presenting for exposure to chlamydia infection.  Patient would like empiric treatment.  No concern for pregnancy, declines testing.  Declines HIV and syphilis testing as well.  Denies fever, n/v, abdominal pain, pelvic pain, rashes, dysuria, urinary frequency, hematuria, vaginal discharge.    No current facility-administered medications for this encounter.  Current Outpatient Medications:    amitriptyline (ELAVIL) 25 MG tablet, Take 25 mg by mouth at bedtime., Disp: , Rfl:    azithromycin  (ZITHROMAX ) 500 MG tablet, Take 1 pill tomorrow and 1 pill the next day (Patient not taking: Reported on 03/05/2024), Disp: 2 tablet, Rfl: 0   etonogestrel (NEXPLANON) 68 MG IMPL implant, 1 each by Subdermal route continuous., Disp: , Rfl:    metroNIDAZOLE  (FLAGYL ) 500 MG tablet, Take 1 tablet (500 mg total) by mouth 2 (two) times daily. (Patient not taking: Reported on 03/05/2024), Disp: 14 tablet, Rfl: 0   ondansetron  (ZOFRAN -ODT) 4 MG disintegrating tablet, 4mg  ODT q4 hours prn nausea/vomit (Patient not taking: Reported on 03/05/2024), Disp: 12 tablet, Rfl: 0   Allergies  Allergen Reactions   Doxycycline Nausea And Vomiting    History reviewed. No pertinent past medical history.   Past Surgical History:  Procedure Laterality Date   NO PAST SURGERIES      Family History  Problem Relation Age of Onset   Hypertension Maternal Aunt    Hypertension Maternal Uncle    Diabetes Maternal Grandmother    Hypertension Maternal Grandmother    Hypertension Mother     Social History   Tobacco Use   Smoking status: Never    Passive exposure: Current   Smokeless tobacco: Never  Vaping Use   Vaping status: Every Day   Substances: Nicotine, Mixture of cannabinoids   Substance Use Topics   Alcohol use: Yes    Comment: occ   Drug use: Not Currently    Types: Marijuana    ROS   Objective:   Vitals: BP (!) 149/92 (BP Location: Left Arm)   Pulse 96   Temp 99.8 F (37.7 C) (Oral)   Resp 20   LMP 08/18/2024   SpO2 99%   Physical Exam Constitutional:      General: She is not in acute distress.    Appearance: Normal appearance. She is well-developed. She is not ill-appearing, toxic-appearing or diaphoretic.  HENT:     Head: Normocephalic and atraumatic.     Nose: Nose normal.     Mouth/Throat:     Mouth: Mucous membranes are moist.  Eyes:     General: No scleral icterus.       Right eye: No discharge.        Left eye: No discharge.     Extraocular Movements: Extraocular movements intact.  Cardiovascular:     Rate and Rhythm: Normal rate.  Pulmonary:     Effort: Pulmonary effort is normal.  Skin:    General: Skin is warm and dry.  Neurological:     General: No focal deficit present.     Mental Status: She is alert and oriented to person, place, and time.  Psychiatric:        Mood and Affect: Mood normal.        Behavior: Behavior normal.     Assessment and Plan :  PDMP not reviewed this encounter.  1. Exposure to chlamydia   2. Screen for STD (sexually transmitted disease)    Patient is previously had a difficult time tolerating doxycycline but would like to try it now.  I did recommend taking the antibiotic with food and using Zofran  prior to taking doxycycline.  Abstain for 1 to 2 weeks.  Labs pending.  Counseled patient on potential for adverse effects with medications prescribed/recommended today, ER and return-to-clinic precautions discussed, patient verbalized understanding.    Christopher Savannah, [place] 09/08/24 [id number]

## 2024-09-10 ENCOUNTER — Ambulatory Visit (HOSPITAL_COMMUNITY): Payer: Self-pay

## 2024-09-10 LAB — CERVICOVAGINAL ANCILLARY ONLY
Chlamydia: POSITIVE — AB
Comment: NEGATIVE
Comment: NEGATIVE
Comment: NORMAL
Neisseria Gonorrhea: NEGATIVE
Trichomonas: NEGATIVE

## 2025-01-19 ENCOUNTER — Encounter: Admitting: Obstetrics and Gynecology

## 2025-03-05 ENCOUNTER — Encounter: Admitting: Family
